# Patient Record
Sex: Female | Born: 1950
Health system: Southern US, Community
[De-identification: ages and names within clinical notes are randomized; demographics above are authoritative.]

## PROBLEM LIST (undated history)

## (undated) DIAGNOSIS — Z973 Presence of spectacles and contact lenses: Secondary | ICD-10-CM

## (undated) DIAGNOSIS — M4800 Spinal stenosis, site unspecified: Secondary | ICD-10-CM

## (undated) DIAGNOSIS — M8580 Other specified disorders of bone density and structure, unspecified site: Secondary | ICD-10-CM

## (undated) DIAGNOSIS — J302 Other seasonal allergic rhinitis: Secondary | ICD-10-CM

## (undated) DIAGNOSIS — I10 Essential (primary) hypertension: Secondary | ICD-10-CM

## (undated) DIAGNOSIS — C801 Malignant (primary) neoplasm, unspecified: Secondary | ICD-10-CM

## (undated) DIAGNOSIS — R011 Cardiac murmur, unspecified: Secondary | ICD-10-CM

## (undated) HISTORY — PX: TUBAL LIGATION: SHX77

## (undated) HISTORY — PX: COLONOSCOPY: SHX174

## (undated) HISTORY — PX: REDUCTION MAMMAPLASTY: SUR839

## (undated) HISTORY — PX: DILATION AND CURETTAGE OF UTERUS: SHX78

## (undated) HISTORY — PX: MASTECTOMY: SHX3

---

## 1987-06-12 HISTORY — PX: SIMPLE MASTECTOMY: SHX6097

## 1987-06-12 HISTORY — PX: BREAST RECONSTRUCTION: SHX9

## 1997-09-09 ENCOUNTER — Encounter: Admission: RE | Admit: 1997-09-09 | Discharge: 1997-12-08 | Payer: Self-pay | Admitting: Orthopedic Surgery

## 1999-03-13 ENCOUNTER — Encounter: Admission: RE | Admit: 1999-03-13 | Discharge: 1999-04-06 | Payer: Self-pay | Admitting: *Deleted

## 1999-04-12 ENCOUNTER — Other Ambulatory Visit: Admission: RE | Admit: 1999-04-12 | Discharge: 1999-04-12 | Payer: Self-pay | Admitting: Internal Medicine

## 1999-06-06 ENCOUNTER — Encounter: Admission: RE | Admit: 1999-06-06 | Discharge: 1999-06-06 | Payer: Self-pay | Admitting: *Deleted

## 1999-06-06 ENCOUNTER — Encounter: Payer: Self-pay | Admitting: *Deleted

## 2000-01-08 ENCOUNTER — Encounter: Admission: RE | Admit: 2000-01-08 | Discharge: 2000-01-08 | Payer: Self-pay | Admitting: Internal Medicine

## 2000-01-08 ENCOUNTER — Encounter: Payer: Self-pay | Admitting: Internal Medicine

## 2000-04-15 ENCOUNTER — Encounter: Admission: RE | Admit: 2000-04-15 | Discharge: 2000-04-30 | Payer: Self-pay | Admitting: Occupational Medicine

## 2000-04-15 ENCOUNTER — Encounter
Admission: RE | Admit: 2000-04-15 | Discharge: 2000-04-30 | Payer: Self-pay | Admitting: Physical Medicine & Rehabilitation

## 2000-04-30 ENCOUNTER — Other Ambulatory Visit: Admission: RE | Admit: 2000-04-30 | Discharge: 2000-04-30 | Payer: Self-pay | Admitting: Internal Medicine

## 2000-05-06 ENCOUNTER — Encounter: Payer: Self-pay | Admitting: Internal Medicine

## 2000-05-06 ENCOUNTER — Encounter: Admission: RE | Admit: 2000-05-06 | Discharge: 2000-05-06 | Payer: Self-pay | Admitting: Internal Medicine

## 2001-01-13 ENCOUNTER — Encounter: Payer: Self-pay | Admitting: Internal Medicine

## 2001-01-13 ENCOUNTER — Encounter: Admission: RE | Admit: 2001-01-13 | Discharge: 2001-01-13 | Payer: Self-pay | Admitting: Internal Medicine

## 2001-05-26 ENCOUNTER — Other Ambulatory Visit: Admission: RE | Admit: 2001-05-26 | Discharge: 2001-05-26 | Payer: Self-pay | Admitting: Internal Medicine

## 2002-01-26 ENCOUNTER — Encounter: Payer: Self-pay | Admitting: Internal Medicine

## 2002-01-26 ENCOUNTER — Encounter: Admission: RE | Admit: 2002-01-26 | Discharge: 2002-01-26 | Payer: Self-pay | Admitting: Internal Medicine

## 2002-02-02 ENCOUNTER — Encounter: Payer: Self-pay | Admitting: Internal Medicine

## 2002-02-02 ENCOUNTER — Ambulatory Visit (HOSPITAL_COMMUNITY): Admission: RE | Admit: 2002-02-02 | Discharge: 2002-02-02 | Payer: Self-pay | Admitting: Internal Medicine

## 2002-07-02 ENCOUNTER — Encounter: Payer: Self-pay | Admitting: Internal Medicine

## 2002-07-02 ENCOUNTER — Ambulatory Visit (HOSPITAL_COMMUNITY): Admission: RE | Admit: 2002-07-02 | Discharge: 2002-07-02 | Payer: Self-pay | Admitting: Internal Medicine

## 2002-07-02 ENCOUNTER — Other Ambulatory Visit: Admission: RE | Admit: 2002-07-02 | Discharge: 2002-07-02 | Payer: Self-pay | Admitting: Internal Medicine

## 2003-02-11 ENCOUNTER — Encounter: Admission: RE | Admit: 2003-02-11 | Discharge: 2003-02-11 | Payer: Self-pay | Admitting: Internal Medicine

## 2003-02-11 ENCOUNTER — Encounter: Payer: Self-pay | Admitting: Internal Medicine

## 2003-07-05 ENCOUNTER — Other Ambulatory Visit: Admission: RE | Admit: 2003-07-05 | Discharge: 2003-07-05 | Payer: Self-pay | Admitting: Internal Medicine

## 2003-07-09 ENCOUNTER — Encounter: Admission: RE | Admit: 2003-07-09 | Discharge: 2003-07-09 | Payer: Self-pay | Admitting: Internal Medicine

## 2003-09-07 ENCOUNTER — Encounter: Admission: RE | Admit: 2003-09-07 | Discharge: 2003-10-27 | Payer: Self-pay | Admitting: Internal Medicine

## 2004-03-01 ENCOUNTER — Emergency Department (HOSPITAL_COMMUNITY): Admission: EM | Admit: 2004-03-01 | Discharge: 2004-03-01 | Payer: Self-pay | Admitting: Emergency Medicine

## 2004-07-13 ENCOUNTER — Other Ambulatory Visit: Admission: RE | Admit: 2004-07-13 | Discharge: 2004-07-13 | Payer: Self-pay | Admitting: Obstetrics and Gynecology

## 2004-07-13 ENCOUNTER — Encounter: Admission: RE | Admit: 2004-07-13 | Discharge: 2004-07-13 | Payer: Self-pay | Admitting: Internal Medicine

## 2004-08-03 ENCOUNTER — Encounter (INDEPENDENT_AMBULATORY_CARE_PROVIDER_SITE_OTHER): Payer: Self-pay | Admitting: *Deleted

## 2004-08-03 ENCOUNTER — Ambulatory Visit (HOSPITAL_COMMUNITY): Admission: RE | Admit: 2004-08-03 | Discharge: 2004-08-03 | Payer: Self-pay | Admitting: Obstetrics and Gynecology

## 2004-08-11 ENCOUNTER — Emergency Department (HOSPITAL_COMMUNITY): Admission: EM | Admit: 2004-08-11 | Discharge: 2004-08-11 | Payer: Self-pay | Admitting: Family Medicine

## 2004-09-07 ENCOUNTER — Ambulatory Visit (HOSPITAL_COMMUNITY): Admission: RE | Admit: 2004-09-07 | Discharge: 2004-09-07 | Payer: Self-pay | Admitting: Gastroenterology

## 2005-07-19 ENCOUNTER — Encounter: Admission: RE | Admit: 2005-07-19 | Discharge: 2005-07-19 | Payer: Self-pay | Admitting: Internal Medicine

## 2005-08-16 ENCOUNTER — Other Ambulatory Visit: Admission: RE | Admit: 2005-08-16 | Discharge: 2005-08-16 | Payer: Self-pay | Admitting: Internal Medicine

## 2006-06-11 HISTORY — PX: SHOULDER ARTHROSCOPY W/ ROTATOR CUFF REPAIR: SHX2400

## 2006-07-22 ENCOUNTER — Encounter: Admission: RE | Admit: 2006-07-22 | Discharge: 2006-07-22 | Payer: Self-pay | Admitting: Internal Medicine

## 2006-08-14 ENCOUNTER — Encounter: Admission: RE | Admit: 2006-08-14 | Discharge: 2006-11-12 | Payer: Self-pay | Admitting: Orthopedic Surgery

## 2006-09-06 ENCOUNTER — Other Ambulatory Visit: Admission: RE | Admit: 2006-09-06 | Discharge: 2006-09-06 | Payer: Self-pay | Admitting: *Deleted

## 2007-03-24 ENCOUNTER — Ambulatory Visit (HOSPITAL_COMMUNITY): Admission: RE | Admit: 2007-03-24 | Discharge: 2007-03-24 | Payer: Self-pay | Admitting: Orthopedic Surgery

## 2007-04-01 ENCOUNTER — Encounter: Admission: RE | Admit: 2007-04-01 | Discharge: 2007-06-11 | Payer: Self-pay | Admitting: Orthopedic Surgery

## 2007-06-17 ENCOUNTER — Encounter: Admission: RE | Admit: 2007-06-17 | Discharge: 2007-07-03 | Payer: Self-pay | Admitting: Orthopedic Surgery

## 2007-07-24 ENCOUNTER — Encounter: Admission: RE | Admit: 2007-07-24 | Discharge: 2007-07-24 | Payer: Self-pay | Admitting: Family Medicine

## 2008-07-30 ENCOUNTER — Encounter: Admission: RE | Admit: 2008-07-30 | Discharge: 2008-07-30 | Payer: Self-pay | Admitting: Family Medicine

## 2008-10-19 ENCOUNTER — Other Ambulatory Visit: Admission: RE | Admit: 2008-10-19 | Discharge: 2008-10-19 | Payer: Self-pay | Admitting: Family Medicine

## 2009-08-01 ENCOUNTER — Encounter: Admission: RE | Admit: 2009-08-01 | Discharge: 2009-08-01 | Payer: Self-pay | Admitting: Family Medicine

## 2009-10-07 ENCOUNTER — Ambulatory Visit (HOSPITAL_COMMUNITY): Admission: RE | Admit: 2009-10-07 | Discharge: 2009-10-07 | Payer: Self-pay | Admitting: Family Medicine

## 2009-10-27 ENCOUNTER — Other Ambulatory Visit: Admission: RE | Admit: 2009-10-27 | Discharge: 2009-10-27 | Payer: Self-pay | Admitting: Family Medicine

## 2010-07-06 ENCOUNTER — Other Ambulatory Visit: Payer: Self-pay | Admitting: Family Medicine

## 2010-07-06 DIAGNOSIS — Z1239 Encounter for other screening for malignant neoplasm of breast: Secondary | ICD-10-CM

## 2010-08-02 ENCOUNTER — Ambulatory Visit
Admission: RE | Admit: 2010-08-02 | Discharge: 2010-08-02 | Disposition: A | Discharge status: Home / Self Care | Payer: Commercial Managed Care - PPO | Source: Ambulatory Visit | Attending: Family Medicine | Admitting: Family Medicine

## 2010-08-02 DIAGNOSIS — Z1239 Encounter for other screening for malignant neoplasm of breast: Secondary | ICD-10-CM

## 2010-10-24 NOTE — Op Note (Signed)
NAME:  Kimberly Arnold, Kimberly Arnold                 ACCOUNT NO.:  [id number]   MEDICAL RECORD NO.:  [id number]          PATIENT TYPE:  AMB   LOCATION:  SDS                          FACILITY:  [facility]   PHYSICIAN:  Almedia Balls. Ranell Patrick, M.D. DATE OF BIRTH:  [birth year]   DATE OF PROCEDURE:  03/24/2007  DATE OF DISCHARGE:                               OPERATIVE REPORT   PREOPERATIVE DIAGNOSIS:  Left shoulder impingement and acromioclavicular  arthritis.   POSTOPERATIVE DIAGNOSES:  1. Left shoulder impingement and partial-thickness rotator cuff tear,      superior labral tear anterior to posterior.  2. Acromioclavicular joint arthritis.   PROCEDURE:  1. Left shoulder arthroscopy.  2. Extensive intra-articular debridement of torn superior labrum      anterior to posterior with arthroscopic biceps tenotomy,      arthroscopic subacromial decompression, arthroscopic debridement of      partial-thickness rotator cuff tear followed by open distal      clavicle excision.   SURGEON:  Almedia Balls. Ranell Patrick, M.D.   ASSISTANT:  Donnie Coffin. Durwin Nora, P.A.   ANESTHESIA:  General plus interscalene block.   ESTIMATED BLOOD LOSS:  Minimal.   FLUID REPLACEMENT:  1200 mL crystalloid.   INSTRUMENT COUNT:  Correct.   COMPLICATIONS:  None.   PERIOPERATIVE ANTIBIOTICS:  Given.   INDICATIONS FOR PROCEDURE:  The patient is a 60 year old female with a  history of worsening left shoulder pain.  The patient was felt to have  chronic impingement, possible labral tear and AC joint arthritis which  is symptomatic.  The patient presents now for operative treatment to  restore function and eliminate pain.  Informed consent was obtained.   DESCRIPTION OF PROCEDURE:  After an adequate level of anesthesia was  achieved, the patient was positioned supine on the operating room table.  She was brought up in the modified beach chair position, and all  neurovascular structures were padded appropriately.  The left shoulder  was examined  under anesthesia.  Full passive range of motion was noted.  No instability was noted. We went ahead and performed a sterile prep and  drape of the left shoulder.  We then entered the shoulder  arthroscopically. We identified a torn superior labrum anterior to  posterior.  This was a degenerative labral tear, not repairable.   We performed a biceps tenotomy and labral debridement back to stable  labral rim.  The anterior inferior labrum was intact, and the posterior  labrum was intact. The subscapularis was normal.  The rotator cuff had a  partial-thickness tear involving the supraspinatus.  This was debrided  using a motorized shaver back to the rotator cuff footprint scuffing  that up to encourage healing.   The capsule appeared to be normal in its thickness and appearance.  There were no loose bodies present within the inferior capsular pouch.  After completion of the intra-articular portion of the surgery we placed  the scope in the subacromial space and performed a thorough subacromial  bursectomy and acromioplasty creating a type 1 acromial shape with a  butcher-block technique using a motorized bur.  Following  a thorough  decompression of the rotator cuff outlet we inspected the cuff from the  bursal surface, and this appeared normal.  Following this we went ahead  and concluded the arthroscopy.  We made a small saber incision overlying  the AC joint.  Dissection was carried down through the subcutaneous  tissue using Bovie electrocautery.  We identified the delto-trapezial  fascia and split that in line with its fibers.  Subperiosteal dissection  of the distal clavicle was performed following excision of the 4 to 5 mm  of the distal clavicle.  We waxed the end of the bone after the distal  clavicle resection.  We thoroughly irrigated the AC interval and then  went ahead and closed the delto- trapezial fascia with #0 Vicryl figure-  of-eight suture followed by 2-0 Vicryl  subcutaneous closure and 4-0  Monocryl for the skin and portals.  Steri-Strips were applied followed  by a sterile dressing.   The patient tolerated the surgery well.      Almedia Balls. Ranell Patrick, M.D.  Electronically Signed     SRN/MEDQ  D:  03/24/2007  T:  03/24/2007  Job:  [id number]

## 2010-10-27 NOTE — Op Note (Signed)
NAME:  Kimberly Arnold, KLUGE NO.:  [id number]   MEDICAL RECORD NO.:  [id number]          PATIENT TYPE:  AMB   LOCATION:  ENDO                         FACILITY:  [facility]   PHYSICIAN:  Danise Edge, M.D.   DATE OF BIRTH:  [birth year]   DATE OF PROCEDURE:  09/07/2004  DATE OF DISCHARGE:                                 OPERATIVE REPORT   PROCEDURE:  Colonoscopy.   INDICATIONS:  Ms. Emalia Witkop is a 60 year old female, born Jun 12, 1950.  Ms. Mcconnon is scheduled to undergo her first screening colonoscopy with  polypectomy to prevent colon cancer.   ENDOSCOPIST:  Dr. Reece Agar   PREMEDICATION:  1.  Versed 9 mg.  2.  Demerol 70 mg.   PROCEDURE:  After obtaining informed consent, Ms. Jhaveri was placed in the  left lateral decubitus position.  I administered intravenous Demerol and  intravenous Versed to achieve conscious sedation for the procedure.  The  patient's blood pressure, oxygen saturation, and cardiac rhythm were  monitored throughout the procedure and documented in the medical record.   Anal inspection and digital rectal exam were normal.  The Olympus adjustable  pediatric colonoscope was introduced into the rectum and advanced to the  cecum.  Colonic preparation for the exam today was excellent.  I had some  degree of difficulty in rounding the hepatic flexure and advancing the  endoscope to the cecum as a reminder for future colonoscopic exams.   Rectum normal.  Sigmoid colon and descending colon normal.  Splenic flexure normal.  Transverse colon normal.  Hepatic flexure normal.  Ascending colon normal  Cecum and ileocecal valve normal.   ASSESSMENT:  Normal screening proctocolonoscopy to the cecum.      MJ/MEDQ  D:  09/07/2004  T:  09/07/2004  Job:  [id number]   cc:   Darius Bump, M.D.  [place]  [place] [address]  Fax: [phone]

## 2010-10-27 NOTE — H&P (Signed)
NAME:  Kimberly Arnold, Kimberly Arnold                 ACCOUNT NO.:  [id number]   MEDICAL RECORD NO.:  [id number]          PATIENT TYPE:  AMB   LOCATION:  SDC                           [facility]  [facility]   PHYSICIAN:  Charles A. Delcambre, MDDATE OF BIRTH:  05/25/1951   DATE OF ADMISSION:  08/04/2004  DATE OF DISCHARGE:                                HISTORY & PHYSICAL   This patient is being admitted, on August 04, 2004, to undergo  hysteroscopy, D&C for post menopausal bleeding and thickened endometrium.  Limited ability to pass Pipelle to only 5 cm with a sound to 8 cm was noted,  for this reason with inadequate ability to get an adequate biopsy.  We will  proceed with hysteroscopy, D&C at this time.  Ultrasound had been done  showing endometrial thickness to be 1.3 cm, suspicious.  She is a 60-year-  old, para 1-0-0-1, who over the last two months has been spotting almost  daily.  Last Pap, in February 2006, was negative.   PAST MEDICAL HISTORY:  1.  Hypertension.  2.  Breast cancer.  3.  Osteopenia.  4.  Mild aortic stenosis, aortic sclerosis she states.  5.  Hypercalcemia.  6.  Chronic back pain.   SURGICAL HISTORY:  1.  Mastectomy, June 1989, on the left, breast cancer.  2.  Tubal ligation.  3.  Breast cancer NED since that time.   MEDICATIONS:  1.  Norvasc 10 mg once a day.  2.  Skelaxin rarely for back pain.  3.  Cozaar 50 mg once a day.   ALLERGIES:  1.  ACE INHIBITOR cause a cough.  2.  Bradycardia on BETA-BLOCKER.  3.  ULTRAM and  4.  TETRACYCLINE case rash.  5.  HYDROCHLOROTHIAZIDE elevated calcium.   SOCIAL HISTORY:  Denies tobacco, ethanol, or drug use, or STD exposure in  the past.  The patient is married in a monogamous relationship with her  husband.  Works as a Associate Professor at [employer].   REVIEW OF SYSTEMS:  Denies fever, chills, rashes, lesions, headaches,  dizziness, some seasonal allergies, no chest pain, shortness of breath,  wheezing,  diarrhea, constipation, rectal bleeding, urgency, frequency,  dysuria, galactorrhea, or emotional changes.   PHYSICAL EXAMINATION:  GENERAL:  Alert and oriented  x 3.  No distress.  VITAL SIGNS:  Blood pressure 132/88, heart rate 76, weight 191 pounds,  respirations 18.  HEENT:  Grossly within normal limits.  NECK:  Supple without thyromegaly or adenopathy.  LUNGS:  Clear bilaterally.  BACK:  No CVAT.  __________ nontender to palpation.  HEART:  Regular rate and rhythm without murmur, rub or gallop.  BREASTS:  Symmetrical, otherwise deferred.  Reports normal exam with PCP  recently.  ABDOMEN:  Soft, flat, nontender, no hepatosplenomegaly or masses noted.  PELVIC:  Normal external female __________ discharge or lesions.  No  cervical motion tenderness present.  Multiparous cervix.  Bimanual  examination uterus anteverted, eight weeks size, mobile, nontender.  Adnexa  nontender without masses bilaterally.  Ovaries not palpable bilaterally.  RECTAL:  Not done.  Hemorrhoids not present.  Anus and perineal body  appeared normal.   ASSESSMENT:  Perimenopausal/likely post menopausal bleeding, extended cycle,  and irregular cycling.   PLAN:  1.  Hysteroscopy D&C.  2.  Nothing by mouth past midnight.  3.  Take blood pressure medicines with sip of water the morning of      admission.  4.  Preoperative CBC, chem 20, an EKG, and chest x-ray per anesthesia.   She accepts risks of infection, bleeding, bowel and bladder damage, blood  product risk including hepatitis and HIV exposure, uterine perforation risk.  All questions were answered.  We will proceed as outlined.      CAD/MEDQ  D:  07/28/2004  T:  07/28/2004  Job:  [id number]

## 2010-10-27 NOTE — Op Note (Signed)
NAME:  Kimberly Arnold, Kimberly Arnold                 ACCOUNT NO.:  [id number]   MEDICAL RECORD NO.:  [id number]          PATIENT TYPE:  AMB   LOCATION:  SDC                           [facility]   PHYSICIAN:  Charles A. Delcambre, MDDATE OF BIRTH:  11/14/1950   DATE OF PROCEDURE:  08/03/2004  DATE OF DISCHARGE:                                 OPERATIVE REPORT   PREOPERATIVE DIAGNOSES:  1.  Postmenopausal bleeding versus perimenopausal bleeding.  2.  Menorrhagia.   POSTOPERATIVE DIAGNOSES:  1.  Postmenopausal bleeding versus perimenopausal bleeding.  2.  Menorrhagia.  3.  Endometrial polyp.   PROCEDURES:  1.  Paracervical block.  2.  Hysteroscopy.  3.  Dilation and curettage.  4.  Polypectomy.   SURGEON:  Charles A. Delcambre, MD   ASSISTANT:  None.   COMPLICATIONS:  None.   ESTIMATED BLOOD LOSS:  Less than 25 mL.   SPECIMENS:  1.  Endometrial curettings.  2.  Endometrial polyp.   ANESTHESIA:  Monitored anesthesia care and IV sedation.   SORBITOL LOSS:  Less than 30 mL, noting approximately 50 mL on a sponge.  The suction was not working properly; therefore, loss is estimated to be 15  mL.   OPERATIVE FINDINGS:  Sound to 7 cm.  No evidence of perforation during the  procedure.  Endometrial cavity with an approximately 1.5 cm fundal hyperemic  appearing polyp, otherwise somewhat suspicious.  No suspicious findings  otherwise in the endometrial cavity.  The cavity did not appear to be  atrophic but otherwise normal.  Cavity clearly seen.   DESCRIPTION OF PROCEDURE:  The patient was taken to the operating room and  placed in the supine position.  Sedation was accomplished, and she was  placed in the dorsal lithotomy position and sterile prep and drape was  undertaken.  A single-tooth tenaculum was placed on the cervix, a weighted  speculum in the vagina, and a paracervical block with 0.25% plain Marcaine  was placed and divided 20 mL at 4 and 8 o'clock.  No evidence of  intravascular injection was noted.  The sound was noted to 7 cm, and Hegar  dilators were used to dilate it up pass a 5 mm hysteroscope.  Under  visualization, the hysteroscope was passed to the fundus and there was no  evidence of perforation.  The findings were noted above.  Polyp forceps were  used, given no evidence of perforation the polyp tissue was removed.  A  small banjo curette was passed without evidence of perforation, and  circumferential curetting was undertaken.  A moderate amount of tissue was  obtained with curetting.  Bleeding was minimal, hemostasis was adequate.  All instruments were removed.  The patient was taken to recovery with  physician in attendance, having tolerated her procedure well.      CAD/MEDQ  D:  08/03/2004  T:  08/03/2004  Job:  [id number]

## 2011-01-14 ENCOUNTER — Inpatient Hospital Stay (INDEPENDENT_AMBULATORY_CARE_PROVIDER_SITE_OTHER)
Admission: RE | Admit: 2011-01-14 | Discharge: 2011-01-14 | Disposition: A | Discharge status: Home / Self Care | Payer: Commercial Managed Care - PPO | Source: Ambulatory Visit | Attending: Family Medicine | Admitting: Family Medicine

## 2011-01-14 DIAGNOSIS — L988 Other specified disorders of the skin and subcutaneous tissue: Secondary | ICD-10-CM

## 2011-03-22 LAB — DIFFERENTIAL
Basophils Absolute: 0
Basophils Relative: 1
Eosinophils Absolute: 0.2
Eosinophils Relative: 4
Lymphocytes Relative: 50 — ABNORMAL HIGH
Lymphs Abs: 2.7
Monocytes Absolute: 0.6
Monocytes Relative: 11
Neutro Abs: 1.9
Neutrophils Relative %: 35 — ABNORMAL LOW

## 2011-03-22 LAB — URINALYSIS, ROUTINE W REFLEX MICROSCOPIC
Bilirubin Urine: NEGATIVE
Bilirubin Urine: NEGATIVE
Glucose, UA: NEGATIVE
Glucose, UA: NEGATIVE
Hgb urine dipstick: NEGATIVE
Hgb urine dipstick: NEGATIVE
Ketones, ur: NEGATIVE
Ketones, ur: NEGATIVE
Nitrite: NEGATIVE
Nitrite: NEGATIVE
Protein, ur: NEGATIVE
Protein, ur: NEGATIVE
Specific Gravity, Urine: 1.006
Specific Gravity, Urine: 1.012
Urobilinogen, UA: 0.2
Urobilinogen, UA: 0.2
pH: 7
pH: 7

## 2011-03-22 LAB — BASIC METABOLIC PANEL
BUN: 11
Chloride: 106
Creatinine, Ser: 0.77
GFR calc Af Amer: >60
GFR calc non Af Amer: >60

## 2011-03-22 LAB — URINE CULTURE

## 2011-03-22 LAB — URINE MICROSCOPIC-ADD ON

## 2011-03-22 LAB — APTT: aPTT: 35

## 2011-03-22 LAB — PROTIME-INR
INR: 1
Prothrombin Time: 12.9

## 2011-03-22 LAB — CBC
MCV: 81.5
Platelets: 221
RBC: 5.01
WBC: 5.4

## 2011-03-22 LAB — TYPE AND SCREEN
ABO/RH(D): AB POS
Antibody Screen: NEGATIVE

## 2011-06-26 ENCOUNTER — Other Ambulatory Visit (HOSPITAL_COMMUNITY): Payer: Self-pay | Admitting: Neurosurgery

## 2011-06-26 DIAGNOSIS — M48061 Spinal stenosis, lumbar region without neurogenic claudication: Secondary | ICD-10-CM

## 2011-06-26 DIAGNOSIS — M4310 Spondylolisthesis, site unspecified: Secondary | ICD-10-CM

## 2011-07-06 ENCOUNTER — Ambulatory Visit (HOSPITAL_COMMUNITY)
Admission: RE | Admit: 2011-07-06 | Discharge: 2011-07-06 | Disposition: A | Discharge status: Home / Self Care | Payer: 59 | Source: Ambulatory Visit | Attending: Neurosurgery | Admitting: Neurosurgery

## 2011-07-06 DIAGNOSIS — R209 Unspecified disturbances of skin sensation: Secondary | ICD-10-CM | POA: Insufficient documentation

## 2011-07-06 DIAGNOSIS — M4310 Spondylolisthesis, site unspecified: Secondary | ICD-10-CM

## 2011-07-06 DIAGNOSIS — M48061 Spinal stenosis, lumbar region without neurogenic claudication: Secondary | ICD-10-CM

## 2011-07-06 DIAGNOSIS — M5126 Other intervertebral disc displacement, lumbar region: Secondary | ICD-10-CM | POA: Insufficient documentation

## 2011-07-06 DIAGNOSIS — M545 Low back pain, unspecified: Secondary | ICD-10-CM | POA: Insufficient documentation

## 2011-07-06 DIAGNOSIS — R29898 Other symptoms and signs involving the musculoskeletal system: Secondary | ICD-10-CM | POA: Insufficient documentation

## 2011-07-06 DIAGNOSIS — M47817 Spondylosis without myelopathy or radiculopathy, lumbosacral region: Secondary | ICD-10-CM | POA: Insufficient documentation

## 2011-07-06 DIAGNOSIS — M79609 Pain in unspecified limb: Secondary | ICD-10-CM | POA: Insufficient documentation

## 2011-08-14 ENCOUNTER — Other Ambulatory Visit: Payer: Self-pay | Admitting: Family Medicine

## 2011-08-14 DIAGNOSIS — Z1231 Encounter for screening mammogram for malignant neoplasm of breast: Secondary | ICD-10-CM

## 2011-08-23 ENCOUNTER — Ambulatory Visit
Admission: RE | Admit: 2011-08-23 | Discharge: 2011-08-23 | Disposition: A | Discharge status: Home / Self Care | Payer: 59 | Source: Ambulatory Visit | Attending: Family Medicine | Admitting: Family Medicine

## 2011-08-23 DIAGNOSIS — Z1231 Encounter for screening mammogram for malignant neoplasm of breast: Secondary | ICD-10-CM

## 2012-07-16 ENCOUNTER — Other Ambulatory Visit: Payer: Self-pay | Admitting: Family Medicine

## 2012-07-16 DIAGNOSIS — Z1231 Encounter for screening mammogram for malignant neoplasm of breast: Secondary | ICD-10-CM

## 2012-07-16 DIAGNOSIS — Z9012 Acquired absence of left breast and nipple: Secondary | ICD-10-CM

## 2012-08-26 ENCOUNTER — Ambulatory Visit
Admission: RE | Admit: 2012-08-26 | Discharge: 2012-08-26 | Disposition: A | Discharge status: Home / Self Care | Payer: 59 | Source: Ambulatory Visit | Attending: Family Medicine | Admitting: Family Medicine

## 2012-08-26 DIAGNOSIS — Z1231 Encounter for screening mammogram for malignant neoplasm of breast: Secondary | ICD-10-CM

## 2012-08-26 DIAGNOSIS — Z9012 Acquired absence of left breast and nipple: Secondary | ICD-10-CM

## 2012-12-04 ENCOUNTER — Other Ambulatory Visit (HOSPITAL_COMMUNITY)
Admission: RE | Admit: 2012-12-04 | Discharge: 2012-12-04 | Disposition: A | Discharge status: Home / Self Care | Payer: 59 | Source: Ambulatory Visit | Attending: Family Medicine | Admitting: Family Medicine

## 2012-12-04 ENCOUNTER — Other Ambulatory Visit: Payer: Self-pay | Admitting: Family Medicine

## 2012-12-04 DIAGNOSIS — Z124 Encounter for screening for malignant neoplasm of cervix: Secondary | ICD-10-CM | POA: Insufficient documentation

## 2013-07-31 ENCOUNTER — Other Ambulatory Visit: Payer: Self-pay

## 2013-07-31 DIAGNOSIS — Z1231 Encounter for screening mammogram for malignant neoplasm of breast: Secondary | ICD-10-CM

## 2013-08-27 ENCOUNTER — Ambulatory Visit
Admission: RE | Admit: 2013-08-27 | Discharge: 2013-08-27 | Disposition: A | Discharge status: Home / Self Care | Payer: BC Managed Care – PPO | Source: Ambulatory Visit

## 2013-08-27 DIAGNOSIS — Z1231 Encounter for screening mammogram for malignant neoplasm of breast: Secondary | ICD-10-CM

## 2013-10-06 ENCOUNTER — Other Ambulatory Visit: Payer: Self-pay | Admitting: Family Medicine

## 2013-10-06 DIAGNOSIS — T8549XA Other mechanical complication of breast prosthesis and implant, initial encounter: Secondary | ICD-10-CM

## 2013-10-06 DIAGNOSIS — T8543XA Leakage of breast prosthesis and implant, initial encounter: Secondary | ICD-10-CM

## 2013-10-07 ENCOUNTER — Ambulatory Visit
Admission: RE | Admit: 2013-10-07 | Discharge: 2013-10-07 | Disposition: A | Discharge status: Home / Self Care | Payer: BC Managed Care – PPO | Source: Ambulatory Visit | Attending: Family Medicine | Admitting: Family Medicine

## 2013-10-07 DIAGNOSIS — T8549XA Other mechanical complication of breast prosthesis and implant, initial encounter: Secondary | ICD-10-CM

## 2013-10-07 DIAGNOSIS — T8543XA Leakage of breast prosthesis and implant, initial encounter: Secondary | ICD-10-CM

## 2013-11-04 ENCOUNTER — Encounter (HOSPITAL_BASED_OUTPATIENT_CLINIC_OR_DEPARTMENT_OTHER): Payer: Self-pay | Admitting: *Deleted

## 2013-11-04 NOTE — Progress Notes (Signed)
To come in for bmet-will get ekg dr Laurann Montana

## 2013-11-05 ENCOUNTER — Encounter (HOSPITAL_BASED_OUTPATIENT_CLINIC_OR_DEPARTMENT_OTHER)
Admission: RE | Admit: 2013-11-05 | Discharge: 2013-11-05 | Disposition: A | Discharge status: Home / Self Care | Payer: BC Managed Care – PPO | Source: Ambulatory Visit | Attending: Specialist | Admitting: Specialist

## 2013-11-05 DIAGNOSIS — Z79899 Other long term (current) drug therapy: Secondary | ICD-10-CM | POA: Diagnosis not present

## 2013-11-05 DIAGNOSIS — Z7982 Long term (current) use of aspirin: Secondary | ICD-10-CM | POA: Diagnosis not present

## 2013-11-05 DIAGNOSIS — Z853 Personal history of malignant neoplasm of breast: Secondary | ICD-10-CM | POA: Insufficient documentation

## 2013-11-05 DIAGNOSIS — I10 Essential (primary) hypertension: Secondary | ICD-10-CM | POA: Insufficient documentation

## 2013-11-05 DIAGNOSIS — Z888 Allergy status to other drugs, medicaments and biological substances status: Secondary | ICD-10-CM | POA: Diagnosis not present

## 2013-11-05 DIAGNOSIS — Y838 Other surgical procedures as the cause of abnormal reaction of the patient, or of later complication, without mention of misadventure at the time of the procedure: Secondary | ICD-10-CM | POA: Insufficient documentation

## 2013-11-05 DIAGNOSIS — Z6831 Body mass index (BMI) 31.0-31.9, adult: Secondary | ICD-10-CM | POA: Diagnosis not present

## 2013-11-05 DIAGNOSIS — T8549XA Other mechanical complication of breast prosthesis and implant, initial encounter: Secondary | ICD-10-CM | POA: Insufficient documentation

## 2013-11-05 DIAGNOSIS — Y9210 Unspecified residential institution as the place of occurrence of the external cause: Secondary | ICD-10-CM | POA: Insufficient documentation

## 2013-11-05 LAB — BASIC METABOLIC PANEL
BUN: 13 mg/dL (ref 6–23)
CALCIUM: 10.2 mg/dL (ref 8.4–10.5)
CHLORIDE: 106 meq/L (ref 96–112)
CO2: 26 meq/L (ref 19–32)
Creatinine, Ser: 0.81 mg/dL (ref 0.50–1.10)
GFR calc Af Amer: 87 mL/min — ABNORMAL LOW (ref >90)
GFR calc non Af Amer: 75 mL/min — ABNORMAL LOW (ref >90)
GLUCOSE: 94 mg/dL (ref 70–99)
POTASSIUM: 3.8 meq/L (ref 3.7–5.3)
SODIUM: 144 meq/L (ref 137–147)

## 2013-11-09 ENCOUNTER — Ambulatory Visit (HOSPITAL_BASED_OUTPATIENT_CLINIC_OR_DEPARTMENT_OTHER): Payer: BC Managed Care – PPO | Admitting: Anesthesiology

## 2013-11-09 ENCOUNTER — Encounter (HOSPITAL_BASED_OUTPATIENT_CLINIC_OR_DEPARTMENT_OTHER)
Admission: RE | Disposition: A | Discharge status: Home / Self Care | Payer: Self-pay | Source: Ambulatory Visit | Attending: Specialist

## 2013-11-09 ENCOUNTER — Encounter (HOSPITAL_BASED_OUTPATIENT_CLINIC_OR_DEPARTMENT_OTHER): Payer: BC Managed Care – PPO | Admitting: Anesthesiology

## 2013-11-09 ENCOUNTER — Ambulatory Visit (HOSPITAL_BASED_OUTPATIENT_CLINIC_OR_DEPARTMENT_OTHER)
Admission: RE | Admit: 2013-11-09 | Discharge: 2013-11-09 | Disposition: A | Discharge status: Home / Self Care | Payer: BC Managed Care – PPO | Source: Ambulatory Visit | Attending: Specialist | Admitting: Specialist

## 2013-11-09 ENCOUNTER — Encounter (HOSPITAL_BASED_OUTPATIENT_CLINIC_OR_DEPARTMENT_OTHER): Payer: Self-pay

## 2013-11-09 DIAGNOSIS — Z79899 Other long term (current) drug therapy: Secondary | ICD-10-CM | POA: Diagnosis not present

## 2013-11-09 DIAGNOSIS — T8549XA Other mechanical complication of breast prosthesis and implant, initial encounter: Secondary | ICD-10-CM | POA: Diagnosis present

## 2013-11-09 DIAGNOSIS — I10 Essential (primary) hypertension: Secondary | ICD-10-CM | POA: Diagnosis not present

## 2013-11-09 DIAGNOSIS — Y9210 Unspecified residential institution as the place of occurrence of the external cause: Secondary | ICD-10-CM | POA: Diagnosis not present

## 2013-11-09 DIAGNOSIS — Y838 Other surgical procedures as the cause of abnormal reaction of the patient, or of later complication, without mention of misadventure at the time of the procedure: Secondary | ICD-10-CM | POA: Diagnosis not present

## 2013-11-09 DIAGNOSIS — Z6831 Body mass index (BMI) 31.0-31.9, adult: Secondary | ICD-10-CM | POA: Diagnosis not present

## 2013-11-09 DIAGNOSIS — Z853 Personal history of malignant neoplasm of breast: Secondary | ICD-10-CM | POA: Diagnosis not present

## 2013-11-09 DIAGNOSIS — Z7982 Long term (current) use of aspirin: Secondary | ICD-10-CM | POA: Diagnosis not present

## 2013-11-09 DIAGNOSIS — Z888 Allergy status to other drugs, medicaments and biological substances status: Secondary | ICD-10-CM | POA: Diagnosis not present

## 2013-11-09 HISTORY — PX: BREAST CAPSULECTOMY WITH IMPLANT EXCHANGE: SHX5592

## 2013-11-09 HISTORY — DX: Other specified disorders of bone density and structure, unspecified site: M85.80

## 2013-11-09 HISTORY — DX: Malignant (primary) neoplasm, unspecified: C80.1

## 2013-11-09 HISTORY — DX: Other seasonal allergic rhinitis: J30.2

## 2013-11-09 HISTORY — DX: Presence of spectacles and contact lenses: Z97.3

## 2013-11-09 HISTORY — DX: Essential (primary) hypertension: I10

## 2013-11-09 LAB — POCT HEMOGLOBIN-HEMACUE
HEMOGLOBIN: 19.1 g/dL — AB (ref 12.0–15.0)
HEMOGLOBIN: 6.8 g/dL — AB (ref 12.0–15.0)

## 2013-11-09 SURGERY — CAPSULECTOMY, BREAST, WITH REPLACEMENT OF IMPLANT
Anesthesia: General | Site: Breast | Laterality: Left

## 2013-11-09 MED ORDER — FENTANYL CITRATE 0.05 MG/ML IJ SOLN
INTRAMUSCULAR | Status: AC
  Filled 2013-11-09: qty 6

## 2013-11-09 MED ORDER — PROPOFOL 10 MG/ML IV BOLUS
INTRAVENOUS | Status: DC | PRN
  Administered 2013-11-09: 150 mg via INTRAVENOUS

## 2013-11-09 MED ORDER — SUCCINYLCHOLINE CHLORIDE 20 MG/ML IJ SOLN
INTRAMUSCULAR | Status: DC | PRN
  Administered 2013-11-09: 100 mg via INTRAVENOUS

## 2013-11-09 MED ORDER — OXYCODONE HCL 5 MG PO TABS
ORAL_TABLET | ORAL | Status: AC
  Filled 2013-11-09: qty 1

## 2013-11-09 MED ORDER — LIDOCAINE HCL (CARDIAC) 20 MG/ML IV SOLN
INTRAVENOUS | Status: DC | PRN
  Administered 2013-11-09: 60 mg via INTRAVENOUS

## 2013-11-09 MED ORDER — HYDROMORPHONE HCL PF 1 MG/ML IJ SOLN
INTRAMUSCULAR | Status: AC
  Filled 2013-11-09: qty 1

## 2013-11-09 MED ORDER — HYDROMORPHONE HCL PF 1 MG/ML IJ SOLN
0.2500 mg | INTRAMUSCULAR | Status: DC | PRN
  Administered 2013-11-09: 0.5 mg via INTRAVENOUS

## 2013-11-09 MED ORDER — MIDAZOLAM HCL 2 MG/2ML IJ SOLN
INTRAMUSCULAR | Status: AC
  Filled 2013-11-09: qty 2

## 2013-11-09 MED ORDER — ONDANSETRON HCL 4 MG/2ML IJ SOLN
INTRAMUSCULAR | Status: DC | PRN
  Administered 2013-11-09: 4 mg via INTRAVENOUS

## 2013-11-09 MED ORDER — SODIUM CHLORIDE 0.9 % IV SOLN
INTRAVENOUS | Status: DC | PRN
  Administered 2013-11-09: 08:00:00

## 2013-11-09 MED ORDER — FENTANYL CITRATE 0.05 MG/ML IJ SOLN: 50.0000 ug | INTRAMUSCULAR | Status: DC | PRN

## 2013-11-09 MED ORDER — MIDAZOLAM HCL 2 MG/2ML IJ SOLN: 1.0000 mg | INTRAMUSCULAR | Status: DC | PRN

## 2013-11-09 MED ORDER — LACTATED RINGERS IV SOLN
INTRAVENOUS | Status: DC
Start: 2013-11-09 — End: 2013-11-09
  Administered 2013-11-09: 07:00:00 via INTRAVENOUS

## 2013-11-09 MED ORDER — FENTANYL CITRATE 0.05 MG/ML IJ SOLN
INTRAMUSCULAR | Status: DC | PRN
  Administered 2013-11-09: 100 ug via INTRAVENOUS

## 2013-11-09 MED ORDER — DEXAMETHASONE SODIUM PHOSPHATE 4 MG/ML IJ SOLN
INTRAMUSCULAR | Status: DC | PRN
  Administered 2013-11-09: 10 mg via INTRAVENOUS

## 2013-11-09 MED ORDER — LIDOCAINE-EPINEPHRINE 0.5 %-1:200000 IJ SOLN
INTRAMUSCULAR | Status: AC
  Filled 2013-11-09: qty 2

## 2013-11-09 MED ORDER — OXYCODONE HCL 5 MG/5ML PO SOLN: 5.0000 mg | Freq: Once | ORAL | Status: AC | PRN

## 2013-11-09 MED ORDER — CEFAZOLIN SODIUM-DEXTROSE 2-3 GM-% IV SOLR
2.0000 g | INTRAVENOUS | Status: AC
  Administered 2013-11-09: 2 g via INTRAVENOUS

## 2013-11-09 MED ORDER — EPHEDRINE SULFATE 50 MG/ML IJ SOLN
INTRAMUSCULAR | Status: DC | PRN
  Administered 2013-11-09: 10 mg via INTRAVENOUS
  Administered 2013-11-09: 15 mg via INTRAVENOUS

## 2013-11-09 MED ORDER — PROMETHAZINE HCL 25 MG/ML IJ SOLN: 6.2500 mg | INTRAMUSCULAR | Status: DC | PRN

## 2013-11-09 MED ORDER — MIDAZOLAM HCL 2 MG/2ML IJ SOLN: 0.5000 mg | Freq: Once | INTRAMUSCULAR | Status: DC | PRN

## 2013-11-09 MED ORDER — MIDAZOLAM HCL 5 MG/5ML IJ SOLN
INTRAMUSCULAR | Status: DC | PRN
  Administered 2013-11-09: 1 mg via INTRAVENOUS

## 2013-11-09 MED ORDER — LIDOCAINE-EPINEPHRINE 0.5 %-1:200000 IJ SOLN
INTRAMUSCULAR | Status: DC | PRN
  Administered 2013-11-09: 70 mL

## 2013-11-09 MED ORDER — CEFAZOLIN SODIUM-DEXTROSE 2-3 GM-% IV SOLR
INTRAVENOUS | Status: AC
  Filled 2013-11-09: qty 50

## 2013-11-09 MED ORDER — OXYCODONE HCL 5 MG PO TABS
5.0000 mg | ORAL_TABLET | Freq: Once | ORAL | Status: AC | PRN
  Administered 2013-11-09: 5 mg via ORAL

## 2013-11-09 MED ORDER — MEPERIDINE HCL 25 MG/ML IJ SOLN: 6.2500 mg | INTRAMUSCULAR | Status: DC | PRN

## 2013-11-09 SURGICAL SUPPLY — 59 items
BAG DECANTER FOR FLEXI CONT (MISCELLANEOUS) ×2 IMPLANT
BENZOIN TINCTURE PRP APPL 2/3 (GAUZE/BANDAGES/DRESSINGS) ×2 IMPLANT
BINDER BREAST XLRG (GAUZE/BANDAGES/DRESSINGS) ×2 IMPLANT
BLADE KNIFE PERSONA 10 (BLADE) ×2 IMPLANT
BLADE KNIFE PERSONA 15 (BLADE) ×2 IMPLANT
CANISTER SUCT 1200ML W/VALVE (MISCELLANEOUS) ×2 IMPLANT
COVER MAYO STAND STRL (DRAPES) ×2 IMPLANT
COVER TABLE BACK 60X90 (DRAPES) ×2 IMPLANT
DECANTER SPIKE VIAL GLASS SM (MISCELLANEOUS) ×2 IMPLANT
DRAIN CHANNEL 10M FLAT 3/4 FLT (DRAIN) IMPLANT
DRAIN PENROSE 1/4X12 LTX STRL (WOUND CARE) IMPLANT
DRAPE LAPAROSCOPIC ABDOMINAL (DRAPES) ×2 IMPLANT
DRSG PAD ABDOMINAL 8X10 ST (GAUZE/BANDAGES/DRESSINGS) ×4 IMPLANT
ELECT BLADE 6.5 .24CM SHAFT (ELECTRODE) ×2 IMPLANT
ELECT REM PT RETURN 9FT ADLT (ELECTROSURGICAL) ×2
ELECTRODE REM PT RTRN 9FT ADLT (ELECTROSURGICAL) ×1 IMPLANT
EVACUATOR SILICONE 100CC (DRAIN) ×2 IMPLANT
GAUZE SPONGE 4X4 12PLY STRL (GAUZE/BANDAGES/DRESSINGS) ×2 IMPLANT
GAUZE XEROFORM 5X9 LF (GAUZE/BANDAGES/DRESSINGS) ×2 IMPLANT
GLOVE BIO SURGEON STRL SZ 6.5 (GLOVE) ×2 IMPLANT
GLOVE BIOGEL M STRL SZ7.5 (GLOVE) ×2 IMPLANT
GLOVE BIOGEL PI IND STRL 8 (GLOVE) ×1 IMPLANT
GLOVE BIOGEL PI INDICATOR 8 (GLOVE) ×1
GLOVE ECLIPSE 7.0 STRL STRAW (GLOVE) ×2 IMPLANT
GOWN STRL REUS W/TWL XL LVL3 (GOWN DISPOSABLE) ×4 IMPLANT
IMPLANT SALINE 525CC PLUS 50 (Breast) ×2 IMPLANT
IV NS 500ML (IV SOLUTION) ×1
IV NS 500ML BAXH (IV SOLUTION) ×1 IMPLANT
KIT FILL SYSTEM UNIVERSAL (SET/KITS/TRAYS/PACK) ×2 IMPLANT
NDL SAFETY ECLIPSE 18X1.5 (NEEDLE) ×2 IMPLANT
NEEDLE HYPO 18GX1.5 SHARP (NEEDLE) ×2
NEEDLE HYPO 25X1 1.5 SAFETY (NEEDLE) ×2 IMPLANT
NEEDLE SPNL 18GX3.5 QUINCKE PK (NEEDLE) ×2 IMPLANT
NS IRRIG 1000ML POUR BTL (IV SOLUTION) ×2 IMPLANT
PACK BASIN DAY SURGERY FS (CUSTOM PROCEDURE TRAY) ×2 IMPLANT
PEN SKIN MARKING BROAD TIP (MISCELLANEOUS) ×2 IMPLANT
PIN SAFETY STERILE (MISCELLANEOUS) IMPLANT
SLEEVE SCD COMPRESS KNEE MED (MISCELLANEOUS) ×2 IMPLANT
SPONGE LAP 18X18 X RAY DECT (DISPOSABLE) ×4 IMPLANT
STRIP SUTURE WOUND CLOSURE 1/2 (SUTURE) IMPLANT
SUT MNCRL AB 3-0 PS2 18 (SUTURE) ×2 IMPLANT
SUT MON AB 2-0 CT1 36 (SUTURE) ×2 IMPLANT
SUT MON AB 5-0 PS2 18 (SUTURE) IMPLANT
SUT PROLENE 3 0 PS 2 (SUTURE) IMPLANT
SWAB COLLECTION DEVICE MRSA (MISCELLANEOUS) ×2 IMPLANT
SYR 20CC LL (SYRINGE) IMPLANT
SYR 50ML LL SCALE MARK (SYRINGE) ×4 IMPLANT
SYR BULB IRRIGATION 50ML (SYRINGE) ×2 IMPLANT
SYR CONTROL 10ML LL (SYRINGE) ×2 IMPLANT
TAPE HYPAFIX 6X30 (GAUZE/BANDAGES/DRESSINGS) ×2 IMPLANT
TAPE MEASURE 72IN RETRACT (INSTRUMENTS)
TAPE MEASURE LINEN 72IN RETRCT (INSTRUMENTS) IMPLANT
TOWEL OR 17X24 6PK STRL BLUE (TOWEL DISPOSABLE) ×8 IMPLANT
TRAY DSU PREP LF (CUSTOM PROCEDURE TRAY) ×2 IMPLANT
TUBE ANAEROBIC SPECIMEN COL (MISCELLANEOUS) ×2 IMPLANT
TUBE CONNECTING 20X1/4 (TUBING) ×2 IMPLANT
UNDERPAD 30X30 INCONTINENT (UNDERPADS AND DIAPERS) ×4 IMPLANT
VAC PENCILS W/TUBING CLEAR (MISCELLANEOUS) ×2 IMPLANT
YANKAUER SUCT BULB TIP NO VENT (SUCTIONS) ×2 IMPLANT

## 2013-11-09 NOTE — Brief Op Note (Signed)
11/09/2013  6:26 AM  PATIENT:  Kimberly Arnold  63 y.o. female  PRE-OPERATIVE DIAGNOSIS:  Malignant neoplasm of breast (female), unspecified site left   POST-OPERATIVE DIAGNOSIS:  Malignant neoplasm of breast (female), unspecified site left  PROCEDURE:  Procedure(s): REPLACEMENT OF LEFT BREAST IMPLANT CAPSULECTOMY RELEASE OF SCAR TISSUE  (Left)  SURGEON:  Surgeon(s) and Role:    * [name], MD - Primary  PHYSICIAN ASSISTANT:   ASSISTANTS: none   ANESTHESIA:   general  EBL:  Total I/O In: 1200 [I.V.:1200] Out: -   BLOOD ADMINISTERED:none  DRAINS: none   LOCAL MEDICATIONS USED:  LIDOCAINE   SPECIMEN:  Excision  DISPOSITION OF SPECIMEN:  PATHOLOGY  COUNTS:  YES  TOURNIQUET:  * No tourniquets in log *  DICTATION: .Other Dictation: Dictation Number [id number]  PLAN OF CARE: Discharge to home after PACU  PATIENT DISPOSITION:  PACU - hemodynamically stable.   Delay start of Pharmacological VTE agent (>24hrs) due to surgical blood loss or risk of bleeding: not applicable

## 2013-11-09 NOTE — Anesthesia Postprocedure Evaluation (Signed)
  Anesthesia Post-op Note  Patient: Kimberly Arnold  Procedure(s) Performed: Procedure(s): REPLACEMENT OF LEFT BREAST IMPLANT CAPSULECTOMY RELEASE OF SCAR TISSUE  (Left)  Patient Location: PACU  Anesthesia Type:General  Level of Consciousness: awake, alert , oriented and patient cooperative  Airway and Oxygen Therapy: Patient Spontanous Breathing  Post-op Pain: none  Post-op Assessment: Post-op Vital signs reviewed, Patient's Cardiovascular Status Stable, Respiratory Function Stable, Patent Airway, No signs of Nausea or vomiting and Pain level controlled  Post-op Vital Signs: Reviewed and stable  Last Vitals:  Filed Vitals:   11/09/13 1000  BP: 131/76  Pulse: 94  Temp:   Resp: 18    Complications: No apparent anesthesia complications

## 2013-11-09 NOTE — Op Note (Signed)
NAME:  [name], [name] NO.:  [id number]  MEDICAL RECORD NO.:  [id number]  LOCATION:                                 FACILITY:  PHYSICIAN:  Berneta Sages L. Towanda Malkin, M.D.DATE OF BIRTH:  July 29, 1950  DATE OF PROCEDURE:  11/09/2013 DATE OF DISCHARGE:  11/09/2013                              OPERATIVE REPORT   INDICATIONS:  A 63 year old lady who approximately 25 years ago had reconstruction of left breast for breast cancer.  She had an implant placed in that was a combination of saline and gel over the last few days; however, the area has reduced in size, deflated tremendously and over the weekend, she has developed increased firmness of the area.  PROCEDURES PLANNED:  Exploration of left breast, chest reconstructed areas, replacement of implant with new implant, possible capsulectomy.  DESCRIPTION OF PROCEDURE:  The patient was taken to the operating room, after she underwent drawings for the midline inframammary fold areas. She underwent general anesthesia, intubated orally, prep was done to the chest and breast areas.  Using Hibiclens soap and solution, walled off with sterile towels and drapes so as to make a sterile field and 0.5% Xylocaine with epinephrine injected locally in the horizontal incision area of left chest.  The incision was opened with #15 blade down to skin and subcutaneous tissue to underlying pectoralis major muscle.  Muscles divided in direction of its fibers and it was obvious the patient had firm, dense capsular formations.  Total capsulectomy was done 360 degrees, removing thickened skin, also within the pocket were tissues and residue that looked almost like mud and had some clumps and some of these were removed and sent to Pathology for examination.  Some of the fluid that was present which was dark sanguinous fluid, approximately 300 mL was suctioned out.  This was sent to Pathology for C and S as well as cytology and all the capsule  formation that was taken out, which was then sent for evaluation.  After this, I examined the area, palpated the area showing no evidence of any mass effects.  Irrigation approximately 2000 mL of saline to the area and after that we reconstructed the area with another implant, Mentor smooth moderate profile saline implant, style 1600.  We filled it up to 550 mL with good symmetry as compared to the right side.  After this, the muscle was then repaired back with 2-0 Monocryl, subcutaneous tissue and breast tissue with 2-0 Monocryl and then, the skin edges reapproximated with a running subcuticular stitch of 3-0 Monocryl.  Steri-Strips and soft dressing applied to all areas.  She withstood the procedures very well. Estimated blood loss, less than 50 mL.  Complications, none.     Odella Aquas. Towanda Malkin, M.D.     Elie Confer  D:  11/09/2013  T:  11/09/2013  Job:  [id number]

## 2013-11-09 NOTE — Anesthesia Procedure Notes (Signed)
Procedure Name: Intubation Performed by: Terrance Mass Pre-anesthesia Checklist: Patient identified, Emergency Drugs available, Suction available, Patient being monitored and Timeout performed Patient Re-evaluated:Patient Re-evaluated prior to inductionOxygen Delivery Method: Circle system utilized Preoxygenation: Pre-oxygenation with 100% oxygen Intubation Type: IV induction Ventilation: Mask ventilation without difficulty Laryngoscope Size: Miller and 2 Grade View: Grade II Tube type: Oral Tube size: 7.0 mm Number of attempts: 1 Placement Confirmation: ETT inserted through vocal cords under direct vision,  breath sounds checked- equal and bilateral and positive ETCO2 Secured at: 22 cm Tube secured with: Tape Dental Injury: Teeth and Oropharynx as per pre-operative assessment

## 2013-11-09 NOTE — Discharge Instructions (Signed)
Activity (include date of return to work if known) As tolerated: NO showers NO driving No heavy activities  Diet:regular No restrictions:  Wound Care: Keep dressing clean & dry  Do not change dressings Special Instructions: Do not raise arms up Continue to empty, recharge, & record drainage from J-P drains &/or Hemovacs 2-3 times a day, as needed. Call Doctor if any unusual problems occur such as pain, excessive Bleeding, unrelieved Nausea/vomiting, Fever &/or chills When lying down, keep head elevated on 2-3 pillows or back-rest  Follow-up appointment: Please call the office.  The patient received discharge instruction from:___________________________________________   Patient signature ________________________________________ / Date___________    Signature of individual providing instructions/ Date________________       Post Anesthesia Home Care Instructions  Activity: Get plenty of rest for the remainder of the day. A responsible adult should stay with you for 24 hours following the procedure.  For the next 24 hours, DO NOT: -Drive a car -Paediatric nurse -Drink alcoholic beverages -Take any medication unless instructed by your physician -Make any legal decisions or sign important papers.  Meals: Start with liquid foods such as gelatin or soup. Progress to regular foods as tolerated. Avoid greasy, spicy, heavy foods. If nausea and/or vomiting occur, drink only clear liquids until the nausea and/or vomiting subsides. Call your physician if vomiting continues.  Special Instructions/Symptoms: Your throat may feel dry or sore from the anesthesia or the breathing tube placed in your throat during surgery. If this causes discomfort, gargle with warm salt water. The discomfort should disappear within 24 hours.

## 2013-11-09 NOTE — H&P (Signed)
Kimberly Arnold is an 63 y.o. female.   Chief Complaint: Deflation of left implant HPI: SP cancer of the left breast over 20 yrs ago  Now has deflation total on the left side  Past Medical History  Diagnosis Date  . Hypertension   . Cancer     lt breast  . Wears glasses   . Osteopenia   . Seasonal allergies     Past Surgical History  Procedure Laterality Date  . Shoulder arthroscopy w/ rotator cuff repair  2008    left  . Simple mastectomy  1989    left  . Breast reconstruction  1989    left implant after mastectomy  . Colonoscopy    . Dilation and curettage of uterus    . Tubal ligation      History reviewed. No pertinent family history. Social History:  reports that she has never smoked. She does not have any smokeless tobacco history on file. She reports that she drinks alcohol. She reports that she does not use illicit drugs.  Allergies:  Allergies  Allergen Reactions  . Ace Inhibitors   . Beta Adrenergic Blockers   . Hctz [Hydrochlorothiazide]   . Tetracyclines & Related Rash  . Tramadol Rash    Medications Prior to Admission  Medication Sig Dispense Refill  . amLODipine (NORVASC) 10 MG tablet Take 10 mg by mouth daily.      Marland Kitchen aspirin 81 MG tablet Take 81 mg by mouth daily.      . cholecalciferol (VITAMIN D) 1000 UNITS tablet Take 1,000 Units by mouth daily.      . fluticasone (FLONASE) 50 MCG/ACT nasal spray Place into both nostrils as needed for allergies or rhinitis.      Marland Kitchen loratadine (CLARITIN) 10 MG tablet Take 10 mg by mouth as needed for allergies.      Marland Kitchen losartan (COZAAR) 100 MG tablet Take 100 mg by mouth daily.      . Multiple Vitamins-Minerals (MULTIVITAMIN WITH MINERALS) tablet Take 1 tablet by mouth daily.        No results found for this or any previous visit (from the past 48 hour(s)). No results found.  Review of Systems  Constitutional: Negative.   HENT: Negative.   Eyes: Negative.   Respiratory: Negative.   Cardiovascular: Negative.    Gastrointestinal: Negative.   Genitourinary: Negative.   Musculoskeletal: Negative.   Skin: Negative.   Neurological: Negative.   Endo/Heme/Allergies: Negative.   Psychiatric/Behavioral: Negative.     Blood pressure 158/101, pulse 90, temperature 98.4 F (36.9 C), temperature source Oral, resp. rate 20, height 5' 1.5" (1.562 m), weight 77.678 kg (171 lb 4 oz), SpO2 97.00%. Physical Exam   Assessment/Plan Deflation left implant that was placed for reconstruction  For replacement after removal  Kimberly Arnold 11/09/2013, 7:21 AM

## 2013-11-09 NOTE — Anesthesia Preprocedure Evaluation (Addendum)
Anesthesia Evaluation  Patient identified by MRN, date of birth, ID band Patient awake    Reviewed: Allergy & Precautions, H&P , NPO status , Patient's Chart, lab work & pertinent test results  History of Anesthesia Complications Negative for: history of anesthetic complications  Airway Mallampati: II TM Distance: >3 FB Neck ROM: Full    Dental  (+) Dental Advisory Given, Teeth Intact   Pulmonary Recent URI ,  breath sounds clear to auscultation        Cardiovascular hypertension, Pt. on medications - anginaRhythm:Regular Rate:Normal + Systolic murmurs (III/VI)    Neuro/Psych negative neurological ROS     GI/Hepatic negative GI ROS, Neg liver ROS,   Endo/Other  Morbid obesity  Renal/GU negative Renal ROS     Musculoskeletal   Abdominal   Peds  Hematology   Anesthesia Other Findings Breast cancer  Reproductive/Obstetrics                         Anesthesia Physical Anesthesia Plan  ASA: II  Anesthesia Plan: General   Post-op Pain Management:    Induction: Intravenous  Airway Management Planned: Oral ETT  Additional Equipment:   Intra-op Plan:   Post-operative Plan: Extubation in OR  Informed Consent: I have reviewed the patients History and Physical, chart, labs and discussed the procedure including the risks, benefits and alternatives for the proposed anesthesia with the patient or authorized representative who has indicated his/her understanding and acceptance.   Dental advisory given  Plan Discussed with: CRNA and Surgeon  Anesthesia Plan Comments: (Plan routine monitors, GETA Dr. Towanda Malkin and patient agree to F/U murmur with Dr. Laurann Montana)       Anesthesia Quick Evaluation

## 2013-11-09 NOTE — Transfer of Care (Signed)
Immediate Anesthesia Transfer of Care Note  Patient: Kimberly Arnold  Procedure(s) Performed: Procedure(s): REPLACEMENT OF LEFT BREAST IMPLANT CAPSULECTOMY RELEASE OF SCAR TISSUE  (Left)  Patient Location: PACU  Anesthesia Type:General  Level of Consciousness: awake and sedated  Airway & Oxygen Therapy: Patient Spontanous Breathing and Patient connected to face mask oxygen  Post-op Assessment: Report given to PACU RN and Post -op Vital signs reviewed and stable  Post vital signs: Reviewed and stable  Complications: No apparent anesthesia complications

## 2013-11-11 ENCOUNTER — Encounter (HOSPITAL_BASED_OUTPATIENT_CLINIC_OR_DEPARTMENT_OTHER): Payer: Self-pay | Admitting: Specialist

## 2013-11-11 LAB — WOUND CULTURE
Culture: NO GROWTH
Gram Stain: NONE SEEN

## 2013-11-14 LAB — ANAEROBIC CULTURE: GRAM STAIN: NONE SEEN

## 2014-01-13 ENCOUNTER — Other Ambulatory Visit (HOSPITAL_COMMUNITY): Payer: Self-pay | Admitting: Family Medicine

## 2014-01-13 ENCOUNTER — Ambulatory Visit (HOSPITAL_COMMUNITY): Payer: Medicare HMO | Attending: Cardiovascular Disease | Admitting: Cardiology

## 2014-01-13 DIAGNOSIS — R011 Cardiac murmur, unspecified: Secondary | ICD-10-CM

## 2014-01-13 DIAGNOSIS — I359 Nonrheumatic aortic valve disorder, unspecified: Secondary | ICD-10-CM

## 2014-01-13 NOTE — Progress Notes (Signed)
Echo performed. 

## 2014-08-12 ENCOUNTER — Other Ambulatory Visit: Payer: Self-pay

## 2014-08-12 DIAGNOSIS — Z1231 Encounter for screening mammogram for malignant neoplasm of breast: Secondary | ICD-10-CM

## 2014-08-12 DIAGNOSIS — H5210 Myopia, unspecified eye: Secondary | ICD-10-CM | POA: Diagnosis not present

## 2014-08-12 DIAGNOSIS — H524 Presbyopia: Secondary | ICD-10-CM | POA: Diagnosis not present

## 2014-08-31 ENCOUNTER — Ambulatory Visit
Admission: RE | Admit: 2014-08-31 | Discharge: 2014-08-31 | Disposition: A | Discharge status: Home / Self Care | Payer: Commercial Managed Care - HMO | Source: Ambulatory Visit

## 2014-08-31 DIAGNOSIS — Z1231 Encounter for screening mammogram for malignant neoplasm of breast: Secondary | ICD-10-CM | POA: Diagnosis not present

## 2014-09-07 DIAGNOSIS — I359 Nonrheumatic aortic valve disorder, unspecified: Secondary | ICD-10-CM | POA: Diagnosis not present

## 2014-09-07 DIAGNOSIS — I10 Essential (primary) hypertension: Secondary | ICD-10-CM | POA: Diagnosis not present

## 2015-01-11 DIAGNOSIS — I10 Essential (primary) hypertension: Secondary | ICD-10-CM | POA: Diagnosis not present

## 2015-01-11 DIAGNOSIS — M4800 Spinal stenosis, site unspecified: Secondary | ICD-10-CM | POA: Diagnosis not present

## 2015-01-11 DIAGNOSIS — J309 Allergic rhinitis, unspecified: Secondary | ICD-10-CM | POA: Diagnosis not present

## 2015-01-11 DIAGNOSIS — Z0000 Encounter for general adult medical examination without abnormal findings: Secondary | ICD-10-CM | POA: Diagnosis not present

## 2015-01-11 DIAGNOSIS — Z853 Personal history of malignant neoplasm of breast: Secondary | ICD-10-CM | POA: Diagnosis not present

## 2015-01-11 DIAGNOSIS — R030 Elevated blood-pressure reading, without diagnosis of hypertension: Secondary | ICD-10-CM | POA: Diagnosis not present

## 2015-01-11 DIAGNOSIS — I359 Nonrheumatic aortic valve disorder, unspecified: Secondary | ICD-10-CM | POA: Diagnosis not present

## 2015-01-11 DIAGNOSIS — M810 Age-related osteoporosis without current pathological fracture: Secondary | ICD-10-CM | POA: Diagnosis not present

## 2015-01-26 DIAGNOSIS — L905 Scar conditions and fibrosis of skin: Secondary | ICD-10-CM | POA: Diagnosis not present

## 2015-01-26 DIAGNOSIS — C50011 Malignant neoplasm of nipple and areola, right female breast: Secondary | ICD-10-CM | POA: Diagnosis not present

## 2015-03-10 ENCOUNTER — Other Ambulatory Visit: Payer: Self-pay | Admitting: Gastroenterology

## 2015-04-26 DIAGNOSIS — Z23 Encounter for immunization: Secondary | ICD-10-CM | POA: Diagnosis not present

## 2015-05-20 ENCOUNTER — Encounter (HOSPITAL_COMMUNITY): Payer: Self-pay | Admitting: *Deleted

## 2015-05-30 ENCOUNTER — Ambulatory Visit (HOSPITAL_COMMUNITY)
Admission: RE | Admit: 2015-05-30 | Discharge: 2015-05-30 | Disposition: A | Discharge status: Home / Self Care | Payer: Commercial Managed Care - HMO | Source: Ambulatory Visit | Attending: Gastroenterology | Admitting: Gastroenterology

## 2015-05-30 ENCOUNTER — Ambulatory Visit (HOSPITAL_COMMUNITY): Payer: Commercial Managed Care - HMO | Admitting: Anesthesiology

## 2015-05-30 ENCOUNTER — Encounter (HOSPITAL_COMMUNITY)
Admission: RE | Disposition: A | Discharge status: Home / Self Care | Payer: Self-pay | Source: Ambulatory Visit | Attending: Gastroenterology

## 2015-05-30 ENCOUNTER — Encounter (HOSPITAL_COMMUNITY): Payer: Self-pay

## 2015-05-30 DIAGNOSIS — I10 Essential (primary) hypertension: Secondary | ICD-10-CM | POA: Insufficient documentation

## 2015-05-30 DIAGNOSIS — Z853 Personal history of malignant neoplasm of breast: Secondary | ICD-10-CM | POA: Diagnosis not present

## 2015-05-30 DIAGNOSIS — K5730 Diverticulosis of large intestine without perforation or abscess without bleeding: Secondary | ICD-10-CM | POA: Diagnosis not present

## 2015-05-30 DIAGNOSIS — Z1211 Encounter for screening for malignant neoplasm of colon: Secondary | ICD-10-CM | POA: Diagnosis not present

## 2015-05-30 DIAGNOSIS — K5790 Diverticulosis of intestine, part unspecified, without perforation or abscess without bleeding: Secondary | ICD-10-CM | POA: Diagnosis not present

## 2015-05-30 HISTORY — DX: Cardiac murmur, unspecified: R01.1

## 2015-05-30 HISTORY — PX: COLONOSCOPY WITH PROPOFOL: SHX5780

## 2015-05-30 HISTORY — DX: Spinal stenosis, site unspecified: M48.00

## 2015-05-30 SURGERY — COLONOSCOPY WITH PROPOFOL
Anesthesia: Monitor Anesthesia Care

## 2015-05-30 MED ORDER — LACTATED RINGERS IV SOLN
INTRAVENOUS | Status: DC
  Administered 2015-05-30: 1000 mL via INTRAVENOUS

## 2015-05-30 MED ORDER — PROPOFOL 500 MG/50ML IV EMUL
INTRAVENOUS | Status: DC | PRN
  Administered 2015-05-30: 120 ug/kg/min via INTRAVENOUS

## 2015-05-30 MED ORDER — PROPOFOL 10 MG/ML IV BOLUS
INTRAVENOUS | Status: AC
  Filled 2015-05-30: qty 40

## 2015-05-30 MED ORDER — LIDOCAINE HCL (CARDIAC) 20 MG/ML IV SOLN
INTRAVENOUS | Status: AC
  Filled 2015-05-30: qty 5

## 2015-05-30 MED ORDER — SODIUM CHLORIDE 0.9 % IV SOLN: INTRAVENOUS | Status: DC

## 2015-05-30 MED ORDER — PROPOFOL 500 MG/50ML IV EMUL
INTRAVENOUS | Status: DC | PRN
  Administered 2015-05-30: 40 mg via INTRAVENOUS

## 2015-05-30 SURGICAL SUPPLY — 21 items

## 2015-05-30 NOTE — Transfer of Care (Signed)
Immediate Anesthesia Transfer of Care Note  Patient: Kimberly Arnold  Procedure(s) Performed: Procedure(s): COLONOSCOPY WITH PROPOFOL (N/A)  Patient Location: PACU  Anesthesia Type:MAC  Level of Consciousness: awake, alert  and oriented  Airway & Oxygen Therapy: Patient Spontanous Breathing and Patient connected to face mask oxygen  Post-op Assessment: Report given to RN and Post -op Vital signs reviewed and stable  Post vital signs: Reviewed and stable  Last Vitals:  Filed Vitals:   05/30/15 0749  BP: 179/109  Pulse: 70  Temp: 36.6 C  Resp: 16    Complications: No apparent anesthesia complications

## 2015-05-30 NOTE — Anesthesia Postprocedure Evaluation (Signed)
Anesthesia Post Note  Patient: Kimberly Arnold  Procedure(s) Performed: Procedure(s) (LRB): COLONOSCOPY WITH PROPOFOL (N/A)  Patient location during evaluation: PACU Anesthesia Type: MAC Level of consciousness: awake and alert Pain management: pain level controlled Vital Signs Assessment: post-procedure vital signs reviewed and stable Respiratory status: spontaneous breathing, nonlabored ventilation, respiratory function stable and patient connected to nasal cannula oxygen Cardiovascular status: stable and blood pressure returned to baseline Anesthetic complications: no    Last Vitals:  Filed Vitals:   05/30/15 0958 05/30/15 1000  BP: 151/87 151/87  Pulse: 57 57  Temp:    Resp: 19 19    Last Pain:  Filed Vitals:   05/30/15 1001  PainSc: 2                  Effie Berkshire

## 2015-05-30 NOTE — H&P (Signed)
  Procedure: Screening colonoscopy. Normal screening colonoscopy performed on 09/07/2004  History: The patient is a 64 year old female born 12/27/1950. She is scheduled to undergo a repeat screening colonoscopy today.  Past medical history: Hypertension. Breast cancer. Vitamin D deficiency. Bilateral rotator cuff surgeries. Mastectomy for breast cancer. Bilateral tubal ligation. D&C with uterine polypectomy.  Medication allergies: Keflex causes rash. Hydrochlorothiazide caused hypercalcemia. ACE inhibitors cause cough. Hydrocodone cause nausea. Metoprolol cause fatigue.  Exam: The patient is alert and lying comfortably on the endoscopy stretcher. Abdomen is soft and nontender to palpation. Cardiac exam reveals a regular rhythm. Lungs are clear to auscultation.  Plan: Proceed with screening colonoscopy

## 2015-05-30 NOTE — Discharge Instructions (Signed)

## 2015-05-30 NOTE — Anesthesia Preprocedure Evaluation (Addendum)
Anesthesia Evaluation  Patient identified by MRN, date of birth, ID band Patient awake    Reviewed: Allergy & Precautions, NPO status , Patient's Chart, lab work & pertinent test results  Airway Mallampati: II  TM Distance: >3 FB Neck ROM: Full    Dental  (+) Teeth Intact   Pulmonary neg pulmonary ROS,    breath sounds clear to auscultation       Cardiovascular hypertension, Pt. on medications  Rhythm:Regular Rate:Normal     Neuro/Psych negative neurological ROS  negative psych ROS   GI/Hepatic negative GI ROS, Neg liver ROS,   Endo/Other  negative endocrine ROS  Renal/GU negative Renal ROS  negative genitourinary   Musculoskeletal negative musculoskeletal ROS (+)   Abdominal   Peds negative pediatric ROS (+)  Hematology negative hematology ROS (+)   Anesthesia Other Findings   Reproductive/Obstetrics negative OB ROS                            Lab Results  Component Value Date   WBC 5.4 03/21/2007   HGB 19.1* 11/09/2013   HCT 40.8 03/21/2007   MCV 81.5 03/21/2007   PLT 221 03/21/2007   Lab Results  Component Value Date   CREATININE 0.81 11/05/2013   BUN 13 11/05/2013   NA 144 11/05/2013   K 3.8 11/05/2013   CL 106 11/05/2013   CO2 26 11/05/2013   Lab Results  Component Value Date   INR 1.0 03/21/2007     Anesthesia Physical Anesthesia Plan  ASA: II  Anesthesia Plan: MAC   Post-op Pain Management:    Induction: Intravenous  Airway Management Planned: Natural Airway and Nasal Cannula  Additional Equipment:   Intra-op Plan:   Post-operative Plan:   Informed Consent: I have reviewed the patients History and Physical, chart, labs and discussed the procedure including the risks, benefits and alternatives for the proposed anesthesia with the patient or authorized representative who has indicated his/her understanding and acceptance.     Plan Discussed with:  CRNA  Anesthesia Plan Comments:         Anesthesia Quick Evaluation

## 2015-05-30 NOTE — Op Note (Signed)
Procedure: Screening colonoscopy. Normal screening colonoscopy performed on 09/07/2004  Endoscopist: Earle Gell  Premedication: Propofol administered by anesthesia  Procedure: The patient was placed in the left lateral decubitus position. Anal inspection and digital rectal exam were normal. The Pentax pediatric colonoscope was introduced into the rectum and advanced to the cecum. A normal-appearing appendiceal orifice and ileocecal valve were identified. Colonic preparation for the exam today was good. Withdrawal time was 9 minutes  Universal colonic diverticulosis was present  Rectum. Normal. Retroflexed view of the distal rectum was normal  Sigmoid colon and descending colon. Normal  Splenic flexure. Normal  Transverse colon. Normal  Hepatic flexure. Normal  Ascending colon. Normal  Cecum and ileocecal valve. Normal  Assessment: Normal screening colonoscopy  Recommendation: Schedule repeat screening colonoscopy in 10 years

## 2015-05-31 ENCOUNTER — Encounter (HOSPITAL_COMMUNITY): Payer: Self-pay | Admitting: Gastroenterology

## 2015-07-14 DIAGNOSIS — I10 Essential (primary) hypertension: Secondary | ICD-10-CM | POA: Diagnosis not present

## 2015-07-14 DIAGNOSIS — E559 Vitamin D deficiency, unspecified: Secondary | ICD-10-CM | POA: Diagnosis not present

## 2015-07-28 ENCOUNTER — Other Ambulatory Visit: Payer: Self-pay

## 2015-07-28 DIAGNOSIS — Z1231 Encounter for screening mammogram for malignant neoplasm of breast: Secondary | ICD-10-CM

## 2015-07-28 DIAGNOSIS — Z9012 Acquired absence of left breast and nipple: Secondary | ICD-10-CM

## 2015-08-04 DIAGNOSIS — H524 Presbyopia: Secondary | ICD-10-CM | POA: Diagnosis not present

## 2015-08-04 DIAGNOSIS — H5210 Myopia, unspecified eye: Secondary | ICD-10-CM | POA: Diagnosis not present

## 2015-09-01 ENCOUNTER — Ambulatory Visit
Admission: RE | Admit: 2015-09-01 | Discharge: 2015-09-01 | Disposition: A | Discharge status: Home / Self Care | Payer: Commercial Managed Care - HMO | Source: Ambulatory Visit

## 2015-09-01 DIAGNOSIS — Z9012 Acquired absence of left breast and nipple: Secondary | ICD-10-CM

## 2015-09-01 DIAGNOSIS — Z1231 Encounter for screening mammogram for malignant neoplasm of breast: Secondary | ICD-10-CM

## 2016-01-23 ENCOUNTER — Other Ambulatory Visit: Payer: Self-pay | Admitting: Family Medicine

## 2016-01-23 DIAGNOSIS — Z124 Encounter for screening for malignant neoplasm of cervix: Secondary | ICD-10-CM | POA: Diagnosis not present

## 2016-01-23 DIAGNOSIS — J309 Allergic rhinitis, unspecified: Secondary | ICD-10-CM | POA: Diagnosis not present

## 2016-01-23 DIAGNOSIS — I359 Nonrheumatic aortic valve disorder, unspecified: Secondary | ICD-10-CM

## 2016-01-23 DIAGNOSIS — I10 Essential (primary) hypertension: Secondary | ICD-10-CM | POA: Diagnosis not present

## 2016-01-23 DIAGNOSIS — M810 Age-related osteoporosis without current pathological fracture: Secondary | ICD-10-CM | POA: Diagnosis not present

## 2016-01-23 DIAGNOSIS — Z0000 Encounter for general adult medical examination without abnormal findings: Secondary | ICD-10-CM | POA: Diagnosis not present

## 2016-01-23 DIAGNOSIS — Z853 Personal history of malignant neoplasm of breast: Secondary | ICD-10-CM | POA: Diagnosis not present

## 2016-02-01 ENCOUNTER — Ambulatory Visit (HOSPITAL_COMMUNITY): Payer: Commercial Managed Care - HMO | Attending: Cardiology

## 2016-02-01 ENCOUNTER — Encounter (INDEPENDENT_AMBULATORY_CARE_PROVIDER_SITE_OTHER): Payer: Self-pay

## 2016-02-01 ENCOUNTER — Other Ambulatory Visit: Payer: Self-pay

## 2016-02-01 DIAGNOSIS — I340 Nonrheumatic mitral (valve) insufficiency: Secondary | ICD-10-CM | POA: Insufficient documentation

## 2016-02-01 DIAGNOSIS — I359 Nonrheumatic aortic valve disorder, unspecified: Secondary | ICD-10-CM | POA: Diagnosis not present

## 2016-02-01 DIAGNOSIS — I119 Hypertensive heart disease without heart failure: Secondary | ICD-10-CM | POA: Insufficient documentation

## 2016-02-01 DIAGNOSIS — I351 Nonrheumatic aortic (valve) insufficiency: Secondary | ICD-10-CM | POA: Diagnosis not present

## 2016-02-01 LAB — ECHOCARDIOGRAM COMPLETE
AVPHT: 648 ms
Ao-asc: 34 cm
CHL CUP DOP CALC LVOT VTI: 24.2 cm
CHL CUP MV DEC (S): 317
CHL CUP STROKE VOLUME: 45 mL
CHL CUP TV REG PEAK VELOCITY: 285 cm/s
E decel time: 317 msec
EERAT: 9.17
FS: 36 % (ref 28–44)
IVS/LV PW RATIO, ED: 1.1
LA ID, A-P, ES: 41 mm
LA diam index: 2.32 cm/m2
LA vol index: 34.5 mL/m2
LA vol: 61 mL
LAVOLA4C: 59 mL
LEFT ATRIUM END SYS DIAM: 41 mm
LV PW d: 12.1 mm — AB (ref 0.6–1.1)
LV SIMPSON'S DISK: 69
LV dias vol index: 37 mL/m2
LV dias vol: 65 mL (ref 46–106)
LV e' LATERAL: 6.95 cm/s
LV sys vol index: 11 mL/m2
LV sys vol: 20 mL (ref 14–42)
LVEEAVG: 9.17
LVEEMED: 9.17
LVOT area: 2.27 cm2
LVOT peak grad rest: 4 mmHg
LVOT peak vel: 95.5 cm/s
LVOTD: 17 mm
LVOTSV: 55 mL
Lateral S' vel: 18.5 cm/s
MV pk E vel: 63.7 m/s
MVPKAVEL: 104 m/s
RV sys press: 35 mmHg
TDI e' lateral: 6.95
TDI e' medial: 8.22
TRMAXVEL: 285 cm/s

## 2016-02-02 DIAGNOSIS — M8588 Other specified disorders of bone density and structure, other site: Secondary | ICD-10-CM | POA: Diagnosis not present

## 2016-02-02 DIAGNOSIS — M810 Age-related osteoporosis without current pathological fracture: Secondary | ICD-10-CM | POA: Diagnosis not present

## 2016-07-25 ENCOUNTER — Other Ambulatory Visit: Payer: Self-pay | Admitting: Family Medicine

## 2016-07-25 DIAGNOSIS — M436 Torticollis: Secondary | ICD-10-CM | POA: Diagnosis not present

## 2016-07-25 DIAGNOSIS — I10 Essential (primary) hypertension: Secondary | ICD-10-CM | POA: Diagnosis not present

## 2016-07-25 DIAGNOSIS — Z1231 Encounter for screening mammogram for malignant neoplasm of breast: Secondary | ICD-10-CM

## 2016-07-25 DIAGNOSIS — I359 Nonrheumatic aortic valve disorder, unspecified: Secondary | ICD-10-CM | POA: Diagnosis not present

## 2016-07-30 DIAGNOSIS — C50912 Malignant neoplasm of unspecified site of left female breast: Secondary | ICD-10-CM | POA: Diagnosis not present

## 2016-07-31 DIAGNOSIS — H524 Presbyopia: Secondary | ICD-10-CM | POA: Diagnosis not present

## 2016-08-24 DIAGNOSIS — C50912 Malignant neoplasm of unspecified site of left female breast: Secondary | ICD-10-CM | POA: Diagnosis not present

## 2016-09-03 ENCOUNTER — Ambulatory Visit
Admission: RE | Admit: 2016-09-03 | Discharge: 2016-09-03 | Disposition: A | Discharge status: Home / Self Care | Payer: Medicare HMO | Source: Ambulatory Visit | Attending: Family Medicine | Admitting: Family Medicine

## 2016-09-03 DIAGNOSIS — Z1231 Encounter for screening mammogram for malignant neoplasm of breast: Secondary | ICD-10-CM

## 2017-01-31 DIAGNOSIS — Z853 Personal history of malignant neoplasm of breast: Secondary | ICD-10-CM | POA: Diagnosis not present

## 2017-01-31 DIAGNOSIS — M810 Age-related osteoporosis without current pathological fracture: Secondary | ICD-10-CM | POA: Diagnosis not present

## 2017-01-31 DIAGNOSIS — J309 Allergic rhinitis, unspecified: Secondary | ICD-10-CM | POA: Diagnosis not present

## 2017-01-31 DIAGNOSIS — I359 Nonrheumatic aortic valve disorder, unspecified: Secondary | ICD-10-CM | POA: Diagnosis not present

## 2017-01-31 DIAGNOSIS — I10 Essential (primary) hypertension: Secondary | ICD-10-CM | POA: Diagnosis not present

## 2017-01-31 DIAGNOSIS — Z0000 Encounter for general adult medical examination without abnormal findings: Secondary | ICD-10-CM | POA: Diagnosis not present

## 2017-06-27 DIAGNOSIS — H01115 Allergic dermatitis of left lower eyelid: Secondary | ICD-10-CM | POA: Diagnosis not present

## 2017-06-27 DIAGNOSIS — H01114 Allergic dermatitis of left upper eyelid: Secondary | ICD-10-CM | POA: Diagnosis not present

## 2017-06-27 DIAGNOSIS — H01111 Allergic dermatitis of right upper eyelid: Secondary | ICD-10-CM | POA: Diagnosis not present

## 2017-06-27 DIAGNOSIS — H01112 Allergic dermatitis of right lower eyelid: Secondary | ICD-10-CM | POA: Diagnosis not present

## 2017-08-05 DIAGNOSIS — I10 Essential (primary) hypertension: Secondary | ICD-10-CM | POA: Diagnosis not present

## 2017-08-05 DIAGNOSIS — E559 Vitamin D deficiency, unspecified: Secondary | ICD-10-CM | POA: Diagnosis not present

## 2017-08-12 ENCOUNTER — Other Ambulatory Visit: Payer: Self-pay | Admitting: Family Medicine

## 2017-08-12 DIAGNOSIS — Z1231 Encounter for screening mammogram for malignant neoplasm of breast: Secondary | ICD-10-CM

## 2017-08-23 DIAGNOSIS — H524 Presbyopia: Secondary | ICD-10-CM | POA: Diagnosis not present

## 2017-08-30 DIAGNOSIS — R509 Fever, unspecified: Secondary | ICD-10-CM | POA: Diagnosis not present

## 2017-08-30 DIAGNOSIS — J101 Influenza due to other identified influenza virus with other respiratory manifestations: Secondary | ICD-10-CM | POA: Diagnosis not present

## 2017-09-06 ENCOUNTER — Ambulatory Visit: Payer: Medicare HMO

## 2017-10-03 ENCOUNTER — Ambulatory Visit
Admission: RE | Admit: 2017-10-03 | Discharge: 2017-10-03 | Disposition: A | Discharge status: Home / Self Care | Payer: Medicare HMO | Source: Ambulatory Visit | Attending: Family Medicine | Admitting: Family Medicine

## 2017-10-03 DIAGNOSIS — Z1231 Encounter for screening mammogram for malignant neoplasm of breast: Secondary | ICD-10-CM

## 2017-10-21 DIAGNOSIS — Z9011 Acquired absence of right breast and nipple: Secondary | ICD-10-CM | POA: Diagnosis not present

## 2017-10-21 DIAGNOSIS — C50912 Malignant neoplasm of unspecified site of left female breast: Secondary | ICD-10-CM | POA: Diagnosis not present

## 2017-11-06 DIAGNOSIS — Z9011 Acquired absence of right breast and nipple: Secondary | ICD-10-CM | POA: Diagnosis not present

## 2017-11-06 DIAGNOSIS — C50912 Malignant neoplasm of unspecified site of left female breast: Secondary | ICD-10-CM | POA: Diagnosis not present

## 2018-03-07 ENCOUNTER — Other Ambulatory Visit (HOSPITAL_COMMUNITY): Payer: Self-pay | Admitting: Family Medicine

## 2018-03-07 DIAGNOSIS — Z0000 Encounter for general adult medical examination without abnormal findings: Secondary | ICD-10-CM | POA: Diagnosis not present

## 2018-03-07 DIAGNOSIS — I359 Nonrheumatic aortic valve disorder, unspecified: Secondary | ICD-10-CM | POA: Diagnosis not present

## 2018-03-07 DIAGNOSIS — Z23 Encounter for immunization: Secondary | ICD-10-CM | POA: Diagnosis not present

## 2018-03-07 DIAGNOSIS — M810 Age-related osteoporosis without current pathological fracture: Secondary | ICD-10-CM | POA: Diagnosis not present

## 2018-03-07 DIAGNOSIS — I10 Essential (primary) hypertension: Secondary | ICD-10-CM | POA: Diagnosis not present

## 2018-03-07 DIAGNOSIS — E559 Vitamin D deficiency, unspecified: Secondary | ICD-10-CM | POA: Diagnosis not present

## 2018-03-07 DIAGNOSIS — M4800 Spinal stenosis, site unspecified: Secondary | ICD-10-CM | POA: Diagnosis not present

## 2018-03-07 DIAGNOSIS — Z853 Personal history of malignant neoplasm of breast: Secondary | ICD-10-CM | POA: Diagnosis not present

## 2018-03-07 DIAGNOSIS — J309 Allergic rhinitis, unspecified: Secondary | ICD-10-CM | POA: Diagnosis not present

## 2018-03-10 ENCOUNTER — Other Ambulatory Visit: Payer: Self-pay

## 2018-03-10 ENCOUNTER — Encounter (INDEPENDENT_AMBULATORY_CARE_PROVIDER_SITE_OTHER): Payer: Self-pay

## 2018-03-10 ENCOUNTER — Ambulatory Visit (HOSPITAL_COMMUNITY): Payer: Medicare HMO | Attending: Cardiology

## 2018-03-10 DIAGNOSIS — R011 Cardiac murmur, unspecified: Secondary | ICD-10-CM | POA: Diagnosis not present

## 2018-03-10 DIAGNOSIS — Z853 Personal history of malignant neoplasm of breast: Secondary | ICD-10-CM | POA: Diagnosis not present

## 2018-03-10 DIAGNOSIS — I351 Nonrheumatic aortic (valve) insufficiency: Secondary | ICD-10-CM | POA: Diagnosis not present

## 2018-03-10 DIAGNOSIS — I10 Essential (primary) hypertension: Secondary | ICD-10-CM | POA: Diagnosis not present

## 2018-03-10 DIAGNOSIS — I359 Nonrheumatic aortic valve disorder, unspecified: Secondary | ICD-10-CM | POA: Diagnosis not present

## 2018-05-29 DIAGNOSIS — J019 Acute sinusitis, unspecified: Secondary | ICD-10-CM | POA: Diagnosis not present

## 2018-07-12 ENCOUNTER — Other Ambulatory Visit: Payer: Self-pay

## 2018-07-12 ENCOUNTER — Encounter (HOSPITAL_COMMUNITY): Payer: Self-pay | Admitting: Emergency Medicine

## 2018-07-12 ENCOUNTER — Ambulatory Visit (HOSPITAL_COMMUNITY)
Admission: EM | Admit: 2018-07-12 | Discharge: 2018-07-12 | Disposition: A | Discharge status: Home / Self Care | Payer: Medicare HMO | Attending: Family Medicine | Admitting: Family Medicine

## 2018-07-12 DIAGNOSIS — T7840XA Allergy, unspecified, initial encounter: Secondary | ICD-10-CM

## 2018-07-12 MED ORDER — MOMETASONE FUROATE 0.1 % EX CREA: 1.0000 "application " | TOPICAL_CREAM | Freq: Every day | CUTANEOUS | 0 refills | Status: DC

## 2018-07-12 NOTE — ED Triage Notes (Signed)
Patient reports bilateral eye lid swelling started Sunday, eye swelling has worsened everyday since then.    No new medicines.  Took mucinex one week prior to swelling starting.    Eyes are itching.  Denies difficulty swallowing, no difficulty breathing

## 2018-07-12 NOTE — Discharge Instructions (Addendum)
Apply cool compresses 3-4 times a day Be careful about hands going to face Remove any nail polish Also take antihistamine such as Zyrtec or Xyzal once daily

## 2018-07-12 NOTE — ED Provider Notes (Signed)
[facility]    CSN: [id number] Arrival date & time: 07/12/18  1037     History   Chief Complaint Chief Complaint  Patient presents with  . Angioedema    HPI Kimberly Arnold is a 68 y.o. female.   Patient complains of swelling of eyelids and tissues underneath the eye.  She denies any swelling of lips tongue throat no difficulty swallowing.  She had manicure done several days prior to the onset.  HPI  Past Medical History:  Diagnosis Date  . Cancer [facility])    lt breast-5 yrs ago (surgery only)-Dr. Murinson-released from care '01  . Heart murmur   . Hypertension   . Osteopenia   . Seasonal allergies   . Spinal stenosis   . Wears glasses     There are no active problems to display for this patient.   Past Surgical History:  Procedure Laterality Date  . BREAST CAPSULECTOMY WITH IMPLANT EXCHANGE Left 11/09/2013   Procedure: REPLACEMENT OF LEFT BREAST IMPLANT CAPSULECTOMY RELEASE OF SCAR TISSUE ;  Surgeon: [name], MD;  Location: [place];  Service: Plastics;  Laterality: Left;  . BREAST RECONSTRUCTION  1989   left implant after mastectomy  . COLONOSCOPY    . COLONOSCOPY WITH PROPOFOL N/A 05/30/2015   Procedure: COLONOSCOPY WITH PROPOFOL;  Surgeon: Garlan Fair, MD;  Location: [facility];  Service: Endoscopy;  Laterality: N/A;  . DILATION AND CURETTAGE OF UTERUS    . MASTECTOMY Left   . REDUCTION MAMMAPLASTY Right   . SHOULDER ARTHROSCOPY W/ ROTATOR CUFF REPAIR  2008   left  . SIMPLE MASTECTOMY  1989   left "modified radical masctectomy"-   . TUBAL LIGATION      OB History   No obstetric history on file.      Home Medications    Prior to Admission medications   Medication Sig Start Date End Date Taking? Authorizing Provider  amLODipine (NORVASC) 10 MG tablet Take 10 mg by mouth daily.   Yes [provider]  aspirin EC 81 MG tablet Take 81 mg by mouth daily.   Yes [provider]  cholecalciferol  (VITAMIN D) 1000 UNITS tablet Take 1,000 Units by mouth daily.   Yes [provider]  fluticasone (FLONASE) 50 MCG/ACT nasal spray Place 2 sprays into both nostrils daily as needed for allergies or rhinitis.    Yes [provider]  loratadine (CLARITIN) 10 MG tablet Take 10 mg by mouth daily as needed for allergies.    Yes [provider]  losartan (COZAAR) 100 MG tablet Take 100 mg by mouth daily.   Yes [provider]    Family History Family History  Problem Relation Age of Onset  . Breast cancer Neg Hx     Social History Social History   Tobacco Use  . Smoking status: Never Smoker  Substance Use Topics  . Alcohol use: Yes    Comment: occ. rare  . Drug use: No     Allergies   Ace inhibitors; Beta adrenergic blockers; Tetracyclines & related; and Tramadol   Review of Systems Review of Systems  Eyes: Positive for itching.     Physical Exam Triage Vital Signs ED Triage Vitals  Enc Vitals Group     BP 07/12/18 1202 (!) 157/81     Pulse Rate 07/12/18 1202 66     Resp 07/12/18 1202 20     Temp 07/12/18 1202 98 F (36.7 C)  Temp Source 07/12/18 1202 Temporal     SpO2 07/12/18 1202 98 %     Weight --      Height --      Head Circumference --      Peak Flow --      Pain Score 07/12/18 1159 0     Pain Loc --      Pain Edu? --      Excl. in [place]? --    No data found.  Updated Vital Signs BP (!) 157/81 (BP Location: Right Arm)   Pulse 66   Temp 98 F (36.7 C) (Temporal)   Resp 20   SpO2 98%   Visual Acuity Right Eye Distance:   Left Eye Distance:   Bilateral Distance:    Right Eye Near:   Left Eye Near:    Bilateral Near:     Physical Exam Constitutional:      Appearance: Normal appearance. She is normal weight.  HENT:     Mouth/Throat:     Mouth: Mucous membranes are moist.     Pharynx: Oropharynx is clear. No oropharyngeal exudate or posterior oropharyngeal erythema.     Comments: There is no swelling of the  tongue or uvula or lips Eyes:     Comments: There is swelling of both upper and lower lids bilaterally as well as the skin underneath eyes. There is no conjunctival erythema.  Neurological:     Mental Status: She is alert.      UC Treatments / Results  Labs (all labs ordered are listed, but only abnormal results are displayed) Labs Reviewed - No data to display  EKG None  Radiology No results found.  Procedures Procedures (including critical care time)  Medications Ordered in UC Medications - No data to display  Initial Impression / Assessment and Plan / UC Course  I have reviewed the triage vital signs and the nursing notes.  Pertinent labs & imaging results that were available during my care of the patient were reviewed by me and considered in my medical decision making (see chart for details).     Allergic reaction eyelids Final Clinical Impressions(s) / UC Diagnoses   Final diagnoses:  None   Discharge Instructions   None    ED Prescriptions    None     Controlled Substance Prescriptions [place] Controlled Substance Registry consulted? No   Wardell Honour, MD 07/12/18 [phone]

## 2018-08-27 ENCOUNTER — Other Ambulatory Visit: Payer: Self-pay | Admitting: Family Medicine

## 2018-08-27 DIAGNOSIS — Z1231 Encounter for screening mammogram for malignant neoplasm of breast: Secondary | ICD-10-CM

## 2018-08-29 DIAGNOSIS — H524 Presbyopia: Secondary | ICD-10-CM | POA: Diagnosis not present

## 2018-10-07 ENCOUNTER — Ambulatory Visit: Payer: Medicare HMO

## 2018-10-08 ENCOUNTER — Ambulatory Visit: Payer: Medicare HMO

## 2018-11-17 ENCOUNTER — Other Ambulatory Visit: Payer: Self-pay

## 2018-11-17 ENCOUNTER — Ambulatory Visit
Admission: RE | Admit: 2018-11-17 | Discharge: 2018-11-17 | Disposition: A | Discharge status: Home / Self Care | Payer: Medicare HMO | Source: Ambulatory Visit | Attending: Family Medicine | Admitting: Family Medicine

## 2018-11-17 DIAGNOSIS — Z1231 Encounter for screening mammogram for malignant neoplasm of breast: Secondary | ICD-10-CM | POA: Diagnosis not present

## 2019-04-20 DIAGNOSIS — M810 Age-related osteoporosis without current pathological fracture: Secondary | ICD-10-CM | POA: Diagnosis not present

## 2019-04-20 DIAGNOSIS — E559 Vitamin D deficiency, unspecified: Secondary | ICD-10-CM | POA: Diagnosis not present

## 2019-04-20 DIAGNOSIS — Z0000 Encounter for general adult medical examination without abnormal findings: Secondary | ICD-10-CM | POA: Diagnosis not present

## 2019-04-20 DIAGNOSIS — Z853 Personal history of malignant neoplasm of breast: Secondary | ICD-10-CM | POA: Diagnosis not present

## 2019-04-20 DIAGNOSIS — J309 Allergic rhinitis, unspecified: Secondary | ICD-10-CM | POA: Diagnosis not present

## 2019-04-20 DIAGNOSIS — Z1389 Encounter for screening for other disorder: Secondary | ICD-10-CM | POA: Diagnosis not present

## 2019-04-20 DIAGNOSIS — I10 Essential (primary) hypertension: Secondary | ICD-10-CM | POA: Diagnosis not present

## 2019-04-20 DIAGNOSIS — I359 Nonrheumatic aortic valve disorder, unspecified: Secondary | ICD-10-CM | POA: Diagnosis not present

## 2019-08-01 ENCOUNTER — Ambulatory Visit: Payer: Medicare HMO | Attending: Internal Medicine

## 2019-08-01 DIAGNOSIS — Z23 Encounter for immunization: Secondary | ICD-10-CM | POA: Insufficient documentation

## 2019-08-01 NOTE — Progress Notes (Signed)
   Covid-19 Vaccination Clinic  Name:  Kimberly Arnold    MRN: [id number] DOB: [birth year]  08/01/2019  [name] was observed post Covid-19 immunization for 15 minutes without incidence. She was provided with Vaccine Information Sheet and instruction to access the V-Safe system.   [name] was instructed to call 911 with any severe reactions post vaccine: Marland Kitchen Difficulty breathing  . Swelling of your face and throat  . A fast heartbeat  . A bad rash all over your body  . Dizziness and weakness    Immunizations Administered    Name Date Dose VIS Date Route   Pfizer COVID-19 Vaccine 08/01/2019  1:44 PM 0.3 mL 05/22/2019 Intramuscular   Manufacturer: [place]   Lot: [id number]   [place]: [id number]

## 2019-08-25 ENCOUNTER — Ambulatory Visit: Payer: Medicare HMO | Attending: Internal Medicine

## 2019-08-25 DIAGNOSIS — Z23 Encounter for immunization: Secondary | ICD-10-CM

## 2019-08-25 NOTE — Progress Notes (Signed)
   Covid-19 Vaccination Clinic  Name:  Kimberly Arnold    MRN: [id number] DOB: 12-18-1950  08/25/2019  Kimberly Arnold was observed post Covid-19 immunization for 15 minutes without incident. She was provided with Vaccine Information Sheet and instruction to access the V-Safe system.   Kimberly Arnold was instructed to call 911 with any severe reactions post vaccine: Marland Kitchen Difficulty breathing  . Swelling of face and throat  . A fast heartbeat  . A bad rash all over body  . Dizziness and weakness   Immunizations Administered    Name Date Dose VIS Date Route   Pfizer COVID-19 Vaccine 08/25/2019 11:10 AM 0.3 mL 05/22/2019 Intramuscular   Manufacturer: [place]   Lot: [id number]   [place]: [id number]

## 2019-08-31 DIAGNOSIS — H524 Presbyopia: Secondary | ICD-10-CM | POA: Diagnosis not present

## 2019-10-16 ENCOUNTER — Other Ambulatory Visit: Payer: Self-pay | Admitting: Family Medicine

## 2019-10-16 DIAGNOSIS — Z1231 Encounter for screening mammogram for malignant neoplasm of breast: Secondary | ICD-10-CM

## 2019-11-19 ENCOUNTER — Ambulatory Visit
Admission: RE | Admit: 2019-11-19 | Discharge: 2019-11-19 | Disposition: A | Discharge status: Home / Self Care | Payer: Medicare HMO | Source: Ambulatory Visit | Attending: Family Medicine | Admitting: Family Medicine

## 2019-11-19 ENCOUNTER — Other Ambulatory Visit: Payer: Self-pay

## 2019-11-19 DIAGNOSIS — Z1231 Encounter for screening mammogram for malignant neoplasm of breast: Secondary | ICD-10-CM | POA: Diagnosis not present

## 2020-04-26 ENCOUNTER — Other Ambulatory Visit: Payer: Self-pay | Admitting: Family Medicine

## 2020-04-26 DIAGNOSIS — E559 Vitamin D deficiency, unspecified: Secondary | ICD-10-CM | POA: Diagnosis not present

## 2020-04-26 DIAGNOSIS — Z23 Encounter for immunization: Secondary | ICD-10-CM | POA: Diagnosis not present

## 2020-04-26 DIAGNOSIS — Z0000 Encounter for general adult medical examination without abnormal findings: Secondary | ICD-10-CM | POA: Diagnosis not present

## 2020-04-26 DIAGNOSIS — M810 Age-related osteoporosis without current pathological fracture: Secondary | ICD-10-CM | POA: Diagnosis not present

## 2020-04-26 DIAGNOSIS — Z1389 Encounter for screening for other disorder: Secondary | ICD-10-CM | POA: Diagnosis not present

## 2020-04-26 DIAGNOSIS — Z853 Personal history of malignant neoplasm of breast: Secondary | ICD-10-CM | POA: Diagnosis not present

## 2020-04-26 DIAGNOSIS — I359 Nonrheumatic aortic valve disorder, unspecified: Secondary | ICD-10-CM | POA: Diagnosis not present

## 2020-04-26 DIAGNOSIS — Z1159 Encounter for screening for other viral diseases: Secondary | ICD-10-CM | POA: Diagnosis not present

## 2020-04-26 DIAGNOSIS — I10 Essential (primary) hypertension: Secondary | ICD-10-CM | POA: Diagnosis not present

## 2020-06-14 ENCOUNTER — Other Ambulatory Visit (HOSPITAL_COMMUNITY): Payer: Self-pay | Admitting: Family Medicine

## 2020-06-14 DIAGNOSIS — I359 Nonrheumatic aortic valve disorder, unspecified: Secondary | ICD-10-CM

## 2020-07-06 ENCOUNTER — Other Ambulatory Visit: Payer: Self-pay

## 2020-07-06 ENCOUNTER — Ambulatory Visit (HOSPITAL_COMMUNITY): Payer: Medicare HMO | Attending: Internal Medicine

## 2020-07-06 DIAGNOSIS — I359 Nonrheumatic aortic valve disorder, unspecified: Secondary | ICD-10-CM | POA: Insufficient documentation

## 2020-07-06 LAB — ECHOCARDIOGRAM COMPLETE
Area-P 1/2: 3.37 cm2
S' Lateral: 2.5 cm

## 2020-08-09 ENCOUNTER — Ambulatory Visit
Admission: RE | Admit: 2020-08-09 | Discharge: 2020-08-09 | Disposition: A | Discharge status: Home / Self Care | Payer: Medicare HMO | Source: Ambulatory Visit | Attending: Family Medicine | Admitting: Family Medicine

## 2020-08-09 ENCOUNTER — Other Ambulatory Visit: Payer: Self-pay

## 2020-08-09 DIAGNOSIS — M810 Age-related osteoporosis without current pathological fracture: Secondary | ICD-10-CM

## 2020-08-09 DIAGNOSIS — M85852 Other specified disorders of bone density and structure, left thigh: Secondary | ICD-10-CM | POA: Diagnosis not present

## 2020-08-09 DIAGNOSIS — Z780 Asymptomatic menopausal state: Secondary | ICD-10-CM | POA: Diagnosis not present

## 2020-09-05 DIAGNOSIS — Z9011 Acquired absence of right breast and nipple: Secondary | ICD-10-CM | POA: Diagnosis not present

## 2020-09-05 DIAGNOSIS — C50912 Malignant neoplasm of unspecified site of left female breast: Secondary | ICD-10-CM | POA: Diagnosis not present

## 2020-09-14 DIAGNOSIS — H524 Presbyopia: Secondary | ICD-10-CM | POA: Diagnosis not present

## 2020-09-28 DIAGNOSIS — C50912 Malignant neoplasm of unspecified site of left female breast: Secondary | ICD-10-CM | POA: Diagnosis not present

## 2020-09-28 DIAGNOSIS — Z9011 Acquired absence of right breast and nipple: Secondary | ICD-10-CM | POA: Diagnosis not present

## 2020-10-27 DIAGNOSIS — I10 Essential (primary) hypertension: Secondary | ICD-10-CM | POA: Diagnosis not present

## 2020-11-17 DIAGNOSIS — M4800 Spinal stenosis, site unspecified: Secondary | ICD-10-CM | POA: Diagnosis not present

## 2020-11-21 DIAGNOSIS — M5136 Other intervertebral disc degeneration, lumbar region: Secondary | ICD-10-CM | POA: Diagnosis not present

## 2020-11-21 DIAGNOSIS — M5417 Radiculopathy, lumbosacral region: Secondary | ICD-10-CM | POA: Diagnosis not present

## 2020-12-19 ENCOUNTER — Other Ambulatory Visit: Payer: Self-pay | Admitting: Family Medicine

## 2020-12-19 DIAGNOSIS — Z1231 Encounter for screening mammogram for malignant neoplasm of breast: Secondary | ICD-10-CM

## 2020-12-21 DIAGNOSIS — M5416 Radiculopathy, lumbar region: Secondary | ICD-10-CM | POA: Diagnosis not present

## 2021-02-09 ENCOUNTER — Ambulatory Visit
Admission: RE | Admit: 2021-02-09 | Discharge: 2021-02-09 | Disposition: A | Discharge status: Home / Self Care | Payer: Medicare HMO | Source: Ambulatory Visit | Attending: Family Medicine | Admitting: Family Medicine

## 2021-02-09 ENCOUNTER — Other Ambulatory Visit: Payer: Self-pay

## 2021-02-09 DIAGNOSIS — Z1231 Encounter for screening mammogram for malignant neoplasm of breast: Secondary | ICD-10-CM

## 2021-05-11 DIAGNOSIS — I10 Essential (primary) hypertension: Secondary | ICD-10-CM | POA: Diagnosis not present

## 2021-05-11 DIAGNOSIS — F419 Anxiety disorder, unspecified: Secondary | ICD-10-CM | POA: Diagnosis not present

## 2021-05-11 DIAGNOSIS — Z853 Personal history of malignant neoplasm of breast: Secondary | ICD-10-CM | POA: Diagnosis not present

## 2021-05-11 DIAGNOSIS — M810 Age-related osteoporosis without current pathological fracture: Secondary | ICD-10-CM | POA: Diagnosis not present

## 2021-05-11 DIAGNOSIS — Z0000 Encounter for general adult medical examination without abnormal findings: Secondary | ICD-10-CM | POA: Diagnosis not present

## 2021-05-11 DIAGNOSIS — J309 Allergic rhinitis, unspecified: Secondary | ICD-10-CM | POA: Diagnosis not present

## 2021-05-11 DIAGNOSIS — E559 Vitamin D deficiency, unspecified: Secondary | ICD-10-CM | POA: Diagnosis not present

## 2021-05-11 DIAGNOSIS — M4800 Spinal stenosis, site unspecified: Secondary | ICD-10-CM | POA: Diagnosis not present

## 2021-05-11 DIAGNOSIS — I359 Nonrheumatic aortic valve disorder, unspecified: Secondary | ICD-10-CM | POA: Diagnosis not present

## 2021-05-30 DIAGNOSIS — S40861A Insect bite (nonvenomous) of right upper arm, initial encounter: Secondary | ICD-10-CM | POA: Diagnosis not present

## 2021-05-30 DIAGNOSIS — R21 Rash and other nonspecific skin eruption: Secondary | ICD-10-CM | POA: Diagnosis not present

## 2021-08-21 DIAGNOSIS — F411 Generalized anxiety disorder: Secondary | ICD-10-CM | POA: Diagnosis not present

## 2021-08-25 DIAGNOSIS — M1711 Unilateral primary osteoarthritis, right knee: Secondary | ICD-10-CM | POA: Diagnosis not present

## 2021-09-11 DIAGNOSIS — H2513 Age-related nuclear cataract, bilateral: Secondary | ICD-10-CM | POA: Diagnosis not present

## 2021-09-12 DIAGNOSIS — H524 Presbyopia: Secondary | ICD-10-CM | POA: Diagnosis not present

## 2021-09-26 DIAGNOSIS — M1711 Unilateral primary osteoarthritis, right knee: Secondary | ICD-10-CM | POA: Diagnosis not present

## 2021-11-14 DIAGNOSIS — F411 Generalized anxiety disorder: Secondary | ICD-10-CM | POA: Diagnosis not present

## 2021-11-14 DIAGNOSIS — J309 Allergic rhinitis, unspecified: Secondary | ICD-10-CM | POA: Diagnosis not present

## 2021-11-14 DIAGNOSIS — I10 Essential (primary) hypertension: Secondary | ICD-10-CM | POA: Diagnosis not present

## 2021-11-25 ENCOUNTER — Ambulatory Visit (INDEPENDENT_AMBULATORY_CARE_PROVIDER_SITE_OTHER): Payer: Medicare HMO

## 2021-11-25 ENCOUNTER — Encounter (HOSPITAL_COMMUNITY): Payer: Self-pay

## 2021-11-25 ENCOUNTER — Ambulatory Visit (HOSPITAL_COMMUNITY)
Admission: EM | Admit: 2021-11-25 | Discharge: 2021-11-25 | Disposition: A | Discharge status: Home / Self Care | Payer: Medicare HMO | Attending: Physician Assistant | Admitting: Physician Assistant

## 2021-11-25 DIAGNOSIS — W19XXXA Unspecified fall, initial encounter: Secondary | ICD-10-CM

## 2021-11-25 DIAGNOSIS — S92355A Nondisplaced fracture of fifth metatarsal bone, left foot, initial encounter for closed fracture: Secondary | ICD-10-CM | POA: Diagnosis not present

## 2021-11-25 DIAGNOSIS — M79672 Pain in left foot: Secondary | ICD-10-CM | POA: Diagnosis not present

## 2021-11-25 MED ORDER — HYDROCODONE-ACETAMINOPHEN 5-325 MG PO TABS: 0.5000 | ORAL_TABLET | Freq: Two times a day (BID) | ORAL | 0 refills | Status: AC | PRN

## 2021-11-25 NOTE — ED Provider Notes (Signed)
[facility]    CSN: [id number] Arrival date & time: 11/25/21  1756      History   Chief Complaint Chief Complaint  Patient presents with   Foot Pain    HPI Kimberly Arnold is a 71 y.o. female.   Patient presents today with a several hour history of lateral left foot pain following injury.  Reports that she fell at approximately 2:30 PM today and has had ongoing pain since that time.  At rest pain is rated 6 on a 0-10 pain scale but increases to 10 with attempted ambulation, localized to lateral left foot without radiation, described as sharp, no aggravating or alleviating factors identified.  She has tried ibuprofen with temporary relief of symptoms.  She is also put ice on the area.  She denies previous injury or surgery involving her foot or ankle.  She does report some lateral numbness of the foot but is able to move her toes without difficulty.  She did not hit her head during the fall and denies any loss of consciousness, headache, dizziness, visual disturbance, nausea, vomiting, amnesia surrounding event.    Past Medical History:  Diagnosis Date   Cancer [facility])    lt breast-5 yrs ago (surgery only)-Dr. Murinson-released from care '01   Heart murmur    Hypertension    Osteopenia    Seasonal allergies    Spinal stenosis    Wears glasses     There are no problems to display for this patient.   Past Surgical History:  Procedure Laterality Date   BREAST CAPSULECTOMY WITH IMPLANT EXCHANGE Left 11/09/2013   Procedure: REPLACEMENT OF LEFT BREAST IMPLANT CAPSULECTOMY RELEASE OF SCAR TISSUE ;  Surgeon: [name], MD;  Location: [place];  Service: Plastics;  Laterality: Left;   BREAST RECONSTRUCTION  1989   left implant after mastectomy   COLONOSCOPY     COLONOSCOPY WITH PROPOFOL N/A 05/30/2015   Procedure: COLONOSCOPY WITH PROPOFOL;  Surgeon: Garlan Fair, MD;  Location: [facility];  Service: Endoscopy;  Laterality: N/A;   DILATION AND  CURETTAGE OF UTERUS     MASTECTOMY Left    REDUCTION MAMMAPLASTY Right    SHOULDER ARTHROSCOPY W/ ROTATOR CUFF REPAIR  2008   left   SIMPLE MASTECTOMY  1989   left "modified radical masctectomy"-    TUBAL LIGATION      OB History   No obstetric history on file.      Home Medications    Prior to Admission medications   Medication Sig Start Date End Date Taking? Authorizing Provider  HYDROcodone-acetaminophen (NORCO/VICODIN) 5-325 MG tablet Take 0.5-1 tablets by mouth 2 (two) times daily as needed for up to 2 days. 11/25/21 11/27/21 Yes Jochebed Bills K, PA-C  amLODipine (NORVASC) 10 MG tablet Take 10 mg by mouth daily.    [provider]  aspirin EC 81 MG tablet Take 81 mg by mouth daily.    [provider]  cholecalciferol (VITAMIN D) 1000 UNITS tablet Take 1,000 Units by mouth daily.    [provider]  fluticasone (FLONASE) 50 MCG/ACT nasal spray Place 2 sprays into both nostrils daily as needed for allergies or rhinitis.     [provider]  loratadine (CLARITIN) 10 MG tablet Take 10 mg by mouth daily as needed for allergies.     [provider]  losartan (COZAAR) 100 MG tablet Take 100 mg by mouth daily.    [provider]  mometasone (ELOCON) 0.1 %  cream Apply 1 application topically daily. Apply to affected areas daily 07/12/18   Wardell Honour, MD    Family History Family History  Problem Relation Age of Onset   Breast cancer Neg Hx     Social History Social History   Tobacco Use   Smoking status: Never  Substance Use Topics   Alcohol use: Yes    Comment: occ. rare   Drug use: No     Allergies   Ace inhibitors, Beta adrenergic blockers, Tetracyclines & related, and Tramadol   Review of Systems Review of Systems  Constitutional:  Positive for activity change. Negative for appetite change, fatigue and fever.  Eyes:  Negative for visual disturbance.  Gastrointestinal:  Negative for nausea and vomiting.   Musculoskeletal:  Positive for arthralgias, gait problem and joint swelling. Negative for myalgias.  Skin:  Negative for color change and wound.  Neurological:  Negative for dizziness, weakness, light-headedness, numbness and headaches.     Physical Exam Triage Vital Signs ED Triage Vitals [11/25/21 1833]  Enc Vitals Group     BP 139/89     Pulse Rate 84     Resp 16     Temp 98.1 F (36.7 C)     Temp Source Oral     SpO2 94 %     Weight      Height      Head Circumference      Peak Flow      Pain Score 4     Pain Loc      Pain Edu?      Excl. in [place]?    No data found.  Updated Vital Signs BP 139/89 (BP Location: Left Arm)   Pulse 84   Temp 98.1 F (36.7 C) (Oral)   Resp 16   SpO2 94%   Visual Acuity Right Eye Distance:   Left Eye Distance:   Bilateral Distance:    Right Eye Near:   Left Eye Near:    Bilateral Near:     Physical Exam Vitals reviewed.  Constitutional:      General: She is awake. She is not in acute distress.    Appearance: Normal appearance. She is well-developed. She is not ill-appearing.     Comments: Very pleasant female appears stated age in no acute distress sitting comfortably in exam room  HENT:     Head: Normocephalic and atraumatic.  Cardiovascular:     Rate and Rhythm: Normal rate and regular rhythm.     Heart sounds: Normal heart sounds, S1 normal and S2 normal. No murmur heard. Pulmonary:     Effort: Pulmonary effort is normal.     Breath sounds: Normal breath sounds. No wheezing, rhonchi or rales.     Comments: Very pleasant female appears stated age in no acute distress sitting comfortably in exam room Musculoskeletal:     Left ankle: Swelling present. No lacerations. No tenderness. Normal range of motion. Anterior drawer test negative.     Left foot: Normal range of motion. Swelling, tenderness and bony tenderness present. Normal pulse.     Comments: Left ankle/foot: Swelling noted lateral left foot and ankle.  No  tenderness palpation over lateral malleolus.  Tenderness and deformity noted over proximal fifth metatarsal.  Normal active range of motion of ankle and phalanges.  Decreased sensation of lateral left foot otherwise neurovascularly intact.  Psychiatric:        Behavior: Behavior is cooperative.      UC Treatments / Results  Labs (all labs ordered are listed, but only abnormal results are displayed) Labs Reviewed - No data to display  EKG   Radiology DG Foot Complete Left  Result Date: 11/25/2021 CLINICAL DATA:  Trauma, fall, pain EXAM: LEFT FOOT - COMPLETE 3+ VIEW COMPARISON:  None Available. FINDINGS: There is faint radiolucent line in the base of left fifth metatarsal. Rest of the bony structures appear intact. Hallux valgus deformity is noted. Degenerative changes are noted in first metatarsophalangeal joint. Plantar spur is seen in the calcaneus. IMPRESSION: Recent undisplaced fracture is seen in the base of left fifth metatarsal. Other findings as described in the body of the report. Electronically Signed   By: Elmer Picker M.D.   On: 11/25/2021 19:18    Procedures Procedures (including critical care time)  Medications Ordered in UC Medications - No data to display  Initial Impression / Assessment and Plan / UC Course  I have reviewed the triage vital signs and the nursing notes.  Pertinent labs & imaging results that were available during my care of the patient were reviewed by me and considered in my medical decision making (see chart for details).     X-ray obtained given mechanism of injury and tenderness over metatarsal which showed nondisplaced metatarsal fracture.  Patient was placed in cam boot with instruction to limit weightbearing activities until seen by podiatry.  She was given contact information for podiatry with instruction to call to schedule an appointment first thing Monday morning.  We do not have previous kidney function in electronic medical record  and patient reports she has been told to limit NSAIDs due to hypertension and chronic kidney disease so we will defer NSAIDs.  She is safely taken hydrocodone/oxycodone in the past and so we will call in a low-dose of hydrocodone (0.5 to 1 tablet of 5/325 tablet twice daily as needed).  Discussed that this is sedating and she should not drive or drink alcohol with taking it.  Recommended RICE protocol.  Discussed that if she has any worsening symptoms including increased pain, swelling, numbness/tingling, cold sensation in her foot, swelling/tightness she needs to be seen immediately to which she expressed understanding.  Strict return precautions given to which she expressed understanding.  Final Clinical Impressions(s) / UC Diagnoses   Final diagnoses:  Left foot pain  Nondisplaced fracture of fifth metatarsal bone, left foot, initial encounter for closed fracture     Discharge Instructions      You have a fracture in your foot.  Please use cam boot and try to limit bearing weight until you are seen by podiatry.  Call them first thing on Monday to schedule an appointment.  You can use hydrocodone 1/2 tablet to 1 tablet twice a day as needed.  This will make you sleepy so do not drive or drink alcohol while taking it.  Keep your foot elevated and also use ice.  If you have any severe pain, numbness/tingling, swelling of the foot, weakness you need to go to the emergency room.     ED Prescriptions     Medication Sig Dispense Auth. Provider   HYDROcodone-acetaminophen (NORCO/VICODIN) 5-325 MG tablet Take 0.5-1 tablets by mouth 2 (two) times daily as needed for up to 2 days. 4 tablet Shalena Ezzell K, PA-C      I have reviewed the PDMP during this encounter.   Terrilee Croak, PA-C 11/25/21 1930

## 2021-11-25 NOTE — ED Triage Notes (Signed)
Pt states she fell today c/o pain/tingling to left ankle. States she put ice on it with some relief.

## 2021-11-25 NOTE — Discharge Instructions (Signed)
You have a fracture in your foot.  Please use cam boot and try to limit bearing weight until you are seen by podiatry.  Call them first thing on Monday to schedule an appointment.  You can use hydrocodone 1/2 tablet to 1 tablet twice a day as needed.  This will make you sleepy so do not drive or drink alcohol while taking it.  Keep your foot elevated and also use ice.  If you have any severe pain, numbness/tingling, swelling of the foot, weakness you need to go to the emergency room.

## 2021-11-29 ENCOUNTER — Ambulatory Visit: Payer: Medicare HMO | Admitting: Podiatry

## 2021-11-29 ENCOUNTER — Encounter: Payer: Self-pay | Admitting: Podiatry

## 2021-11-29 DIAGNOSIS — S92352A Displaced fracture of fifth metatarsal bone, left foot, initial encounter for closed fracture: Secondary | ICD-10-CM

## 2021-11-29 NOTE — Progress Notes (Signed)
  Subjective:  Patient ID: Kimberly Arnold, female    DOB: [birth year],   MRN: [id number]  Chief Complaint  Patient presents with   Fracture    Left foot fracture    71 y.o. female presents for concern of left foot fracture. Relates on Saturday she fell and went to urgent care. She had X-rays done and told she had a fracture. Was given a boot and told to follow-up with [place]. She has been sleeping in boot. Doing well with no swelling and minimal pain . Denies any other pedal complaints. Denies n/v/f/c.   Past Medical History:  Diagnosis Date   Cancer [facility])    lt breast-5 yrs ago (surgery only)-Dr. Murinson-released from care '01   Heart murmur    Hypertension    Osteopenia    Seasonal allergies    Spinal stenosis    Wears glasses     Objective:  Physical Exam: Vascular: DP/PT pulses 2/4 bilateral. CFT <3 seconds. Normal hair growth on digits. No edema.  Skin. No lacerations or abrasions bilateral feet.  Musculoskeletal: MMT 5/5 bilateral lower extremities in DF, PF, Inversion and Eversion. Deceased ROM in DF of ankle joint. Tender to the base of the proximal phalanx. Exam limited due to pain.  Neurological: Sensation intact to light touch.   Assessment:   1. Closed fracture of base of fifth metatarsal bone of left foot, initial encounter      Plan:  Patient was evaluated and treated and all questions answered. -Xrays reviewed from urgent care showing avulsion fracture at base of fifth metatarsal non displaced.  -Discussed treatement options for fifth metatarsal fracture; risks, alternatives, and benefits explained. -Continue with CAM boot -Recommend protection, rest, ice, elevation daily until symptoms improve -Rx pain med/antinflammatories as needed -Patient to return to office in 4 weeks for serial x-rays to assess healing  or sooner if condition worsens.   Lorenda Peck, DPM

## 2021-12-01 DIAGNOSIS — M5416 Radiculopathy, lumbar region: Secondary | ICD-10-CM | POA: Diagnosis not present

## 2022-01-02 ENCOUNTER — Other Ambulatory Visit: Payer: Self-pay | Admitting: Family Medicine

## 2022-01-02 ENCOUNTER — Ambulatory Visit: Payer: Medicare HMO | Admitting: Podiatry

## 2022-01-02 ENCOUNTER — Encounter: Payer: Self-pay | Admitting: Podiatry

## 2022-01-02 ENCOUNTER — Ambulatory Visit (INDEPENDENT_AMBULATORY_CARE_PROVIDER_SITE_OTHER): Payer: Medicare HMO

## 2022-01-02 DIAGNOSIS — Z1231 Encounter for screening mammogram for malignant neoplasm of breast: Secondary | ICD-10-CM

## 2022-01-02 DIAGNOSIS — S92352A Displaced fracture of fifth metatarsal bone, left foot, initial encounter for closed fracture: Secondary | ICD-10-CM

## 2022-01-02 NOTE — Progress Notes (Signed)
  Subjective:  Patient ID: Kimberly Arnold, female    DOB: 10-13-1950,   MRN: [id number]  Chief Complaint  Patient presents with   Foot Pain    Patient states that she is feeling better overall. And that she is ready to retire the boot.    71 y.o. female presents for concern of left foot fracture. Relates it is doing well and not having too much pain and has been wearing the boot.  She has been sleeping in boot. Doing well with no swelling and minimal pain . Denies any other pedal complaints. Denies n/v/f/c.   Past Medical History:  Diagnosis Date   Cancer [facility])    lt breast-5 yrs ago (surgery only)-Dr. Murinson-released from care '01   Heart murmur    Hypertension    Osteopenia    Seasonal allergies    Spinal stenosis    Wears glasses     Objective:  Physical Exam: Vascular: DP/PT pulses 2/4 bilateral. CFT <3 seconds. Normal hair growth on digits. No edema.  Skin. No lacerations or abrasions bilateral feet.  Musculoskeletal: MMT 5/5 bilateral lower extremities in DF, PF, Inversion and Eversion. Deceased ROM in DF of ankle joint. Mildy tender to the base of the proximal phalanx. Exam limited due to pain.  Neurological: Sensation intact to light touch.   Assessment:   1. Closed fracture of base of fifth metatarsal bone of left foot, initial encounter      Plan:  Patient was evaluated and treated and all questions answered. -Xrays reviewed from urgent care showing avulsion fracture at base of fifth metatarsal non displaced.  -Discussed treatement options for fifth metatarsal fracture; risks, alternatives, and benefits explained. -Continue with CAM boot for one more week than transition to regular stiff soled shoe.  -Recommend protection, rest, ice, elevation daily until symptoms improve -Rx pain med/antinflammatories as needed -Patient to return to office in 4 weeks for serial x-rays to assess healing  or sooner if condition worsens.   Lorenda Peck, DPM

## 2022-01-18 DIAGNOSIS — M1711 Unilateral primary osteoarthritis, right knee: Secondary | ICD-10-CM | POA: Diagnosis not present

## 2022-01-30 ENCOUNTER — Ambulatory Visit (INDEPENDENT_AMBULATORY_CARE_PROVIDER_SITE_OTHER): Payer: Medicare HMO

## 2022-01-30 ENCOUNTER — Encounter: Payer: Self-pay | Admitting: Podiatry

## 2022-01-30 ENCOUNTER — Ambulatory Visit: Payer: Medicare HMO | Admitting: Podiatry

## 2022-01-30 DIAGNOSIS — M810 Age-related osteoporosis without current pathological fracture: Secondary | ICD-10-CM | POA: Insufficient documentation

## 2022-01-30 DIAGNOSIS — Z853 Personal history of malignant neoplasm of breast: Secondary | ICD-10-CM | POA: Insufficient documentation

## 2022-01-30 DIAGNOSIS — I359 Nonrheumatic aortic valve disorder, unspecified: Secondary | ICD-10-CM | POA: Insufficient documentation

## 2022-01-30 DIAGNOSIS — F419 Anxiety disorder, unspecified: Secondary | ICD-10-CM | POA: Insufficient documentation

## 2022-01-30 DIAGNOSIS — J309 Allergic rhinitis, unspecified: Secondary | ICD-10-CM | POA: Insufficient documentation

## 2022-01-30 DIAGNOSIS — S92352D Displaced fracture of fifth metatarsal bone, left foot, subsequent encounter for fracture with routine healing: Secondary | ICD-10-CM

## 2022-01-30 DIAGNOSIS — M48061 Spinal stenosis, lumbar region without neurogenic claudication: Secondary | ICD-10-CM | POA: Insufficient documentation

## 2022-01-30 DIAGNOSIS — I10 Essential (primary) hypertension: Secondary | ICD-10-CM | POA: Insufficient documentation

## 2022-01-30 DIAGNOSIS — E559 Vitamin D deficiency, unspecified: Secondary | ICD-10-CM | POA: Insufficient documentation

## 2022-01-30 NOTE — Progress Notes (Signed)
  Subjective:  Patient ID: Kimberly Arnold, female    DOB: 1950-07-01,   MRN: [id number]  Chief Complaint  Patient presents with   Fracture    Follow up fracture 5th met base left   "Its been feeling pretty good"    71 y.o. female presents for concern of left foot fracture. Relates it is doing well and not having too much pain and has been back in regular shoes and doing well.   Doing well with no swelling and minimal pain . Denies any other pedal complaints. Denies n/v/f/c.   Past Medical History:  Diagnosis Date   Cancer [facility])    lt breast-5 yrs ago (surgery only)-Dr. Murinson-released from care '01   Heart murmur    Hypertension    Osteopenia    Seasonal allergies    Spinal stenosis    Wears glasses     Objective:  Physical Exam: Vascular: DP/PT pulses 2/4 bilateral. CFT <3 seconds. Normal hair growth on digits. No edema.  Skin. No lacerations or abrasions bilateral feet.  Musculoskeletal: MMT 5/5 bilateral lower extremities in DF, PF, Inversion and Eversion. Deceased ROM in DF of ankle joint.No pain to palpation in fifth metatarsal area.  Neurological: Sensation intact to light touch.   Assessment:   1. Fracture of base of fifth metatarsal bone of left foot with routine healing, subsequent encounter      Plan:  Patient was evaluated and treated and all questions answered. -Xrays reviewed and healing avulsion fracture of fifth metatarsal base nearly completely healed.  -Discussed treatement options for fifth metatarsal fracture; risks, alternatives, and benefits explained. -Continue in regular shoes.  -Recommend protection, rest, ice, elevation daily until symptoms improve -Rx pain med/antinflammatories as needed -Patient to return to office as needed.    Lorenda Peck, DPM

## 2022-02-13 ENCOUNTER — Ambulatory Visit
Admission: RE | Admit: 2022-02-13 | Discharge: 2022-02-13 | Disposition: A | Discharge status: Home / Self Care | Payer: Medicare HMO | Source: Ambulatory Visit | Attending: Family Medicine | Admitting: Family Medicine

## 2022-02-13 DIAGNOSIS — Z1231 Encounter for screening mammogram for malignant neoplasm of breast: Secondary | ICD-10-CM

## 2022-03-06 DIAGNOSIS — M25512 Pain in left shoulder: Secondary | ICD-10-CM | POA: Diagnosis not present

## 2022-04-28 ENCOUNTER — Emergency Department (HOSPITAL_COMMUNITY)
Admission: EM | Admit: 2022-04-28 | Discharge: 2022-04-29 | Disposition: A | Discharge status: Home / Self Care | Payer: Medicare HMO | Attending: Emergency Medicine | Admitting: Emergency Medicine

## 2022-04-28 ENCOUNTER — Other Ambulatory Visit: Payer: Self-pay

## 2022-04-28 ENCOUNTER — Emergency Department (HOSPITAL_COMMUNITY): Payer: Medicare HMO

## 2022-04-28 DIAGNOSIS — U071 COVID-19: Secondary | ICD-10-CM | POA: Diagnosis not present

## 2022-04-28 DIAGNOSIS — M791 Myalgia, unspecified site: Secondary | ICD-10-CM | POA: Diagnosis present

## 2022-04-28 DIAGNOSIS — Z79899 Other long term (current) drug therapy: Secondary | ICD-10-CM | POA: Insufficient documentation

## 2022-04-28 DIAGNOSIS — R059 Cough, unspecified: Secondary | ICD-10-CM | POA: Diagnosis not present

## 2022-04-28 DIAGNOSIS — R509 Fever, unspecified: Secondary | ICD-10-CM | POA: Diagnosis not present

## 2022-04-28 DIAGNOSIS — I10 Essential (primary) hypertension: Secondary | ICD-10-CM | POA: Diagnosis not present

## 2022-04-28 LAB — CBC WITH DIFFERENTIAL/PLATELET
Abs Immature Granulocytes: 0.01 10*3/uL (ref 0.00–0.07)
Basophils Absolute: 0 10*3/uL (ref 0.0–0.1)
Basophils Relative: 0 %
Eosinophils Absolute: 0 10*3/uL (ref 0.0–0.5)
Eosinophils Relative: 0 %
HCT: 43 % (ref 36.0–46.0)
Hemoglobin: 13.9 g/dL (ref 12.0–15.0)
Immature Granulocytes: 0 %
Lymphocytes Relative: 18 %
Lymphs Abs: 1.7 10*3/uL (ref 0.7–4.0)
MCH: 26.9 pg (ref 26.0–34.0)
MCHC: 32.3 g/dL (ref 30.0–36.0)
MCV: 83.2 fL (ref 80.0–100.0)
Monocytes Absolute: 1 10*3/uL (ref 0.1–1.0)
Monocytes Relative: 11 %
Neutro Abs: 6.3 10*3/uL (ref 1.7–7.7)
Neutrophils Relative %: 71 %
Platelets: 191 10*3/uL (ref 150–400)
RBC: 5.17 MIL/uL — ABNORMAL HIGH (ref 3.87–5.11)
RDW: 15.8 % — ABNORMAL HIGH (ref 11.5–15.5)
WBC: 9 10*3/uL (ref 4.0–10.5)
nRBC: 0 % (ref 0.0–0.2)

## 2022-04-28 LAB — COMPREHENSIVE METABOLIC PANEL
ALT: 27 U/L (ref 0–44)
AST: 30 U/L (ref 15–41)
Albumin: 3.9 g/dL (ref 3.5–5.0)
Alkaline Phosphatase: 57 U/L (ref 38–126)
Anion gap: 13 (ref 5–15)
BUN: 8 mg/dL (ref 8–23)
CO2: 23 mmol/L (ref 22–32)
Calcium: 10 mg/dL (ref 8.9–10.3)
Chloride: 103 mmol/L (ref 98–111)
Creatinine, Ser: 1.11 mg/dL — ABNORMAL HIGH (ref 0.44–1.00)
GFR, Estimated: 52 mL/min — ABNORMAL LOW (ref >60)
Glucose, Bld: 158 mg/dL — ABNORMAL HIGH (ref 70–99)
Potassium: 3 mmol/L — ABNORMAL LOW (ref 3.5–5.1)
Sodium: 139 mmol/L (ref 135–145)
Total Bilirubin: 0.7 mg/dL (ref 0.3–1.2)
Total Protein: 7.5 g/dL (ref 6.5–8.1)

## 2022-04-28 LAB — RESP PANEL BY RT-PCR (FLU A&B, COVID) ARPGX2
Influenza A by PCR: NEGATIVE
Influenza B by PCR: NEGATIVE
SARS Coronavirus 2 by RT PCR: POSITIVE — AB

## 2022-04-28 LAB — CBG MONITORING, ED: Glucose-Capillary: 154 mg/dL — ABNORMAL HIGH (ref 70–99)

## 2022-04-28 MED ORDER — ONDANSETRON 4 MG PO TBDP
4.0000 mg | ORAL_TABLET | Freq: Once | ORAL | Status: AC
  Administered 2022-04-28: 4 mg via ORAL
  Filled 2022-04-28: qty 1

## 2022-04-28 MED ORDER — ACETAMINOPHEN 325 MG PO TABS
650.0000 mg | ORAL_TABLET | Freq: Once | ORAL | Status: AC
  Administered 2022-04-28: 650 mg via ORAL
  Filled 2022-04-28: qty 2

## 2022-04-28 NOTE — ED Triage Notes (Signed)
Pt states she started feeling bad yesterday. Today, she has had fever, cough, all over body aches. Last took nyquil nighttime around 1900, last tylenol around 1500

## 2022-04-28 NOTE — ED Provider Triage Note (Signed)
Emergency Medicine Provider Triage Evaluation Note  Kimberly Arnold , a 71 y.o. female  was evaluated in triage.  Pt complains of fever, cough and body aches. Symptoms began yesterday with scratchy throat. Coughing up white phlegm.. Had temp of 101 at home. Was taking nyquil at home without relief. Today had hot and cold chills. Decreased appetite and temp up to 102.5. denies chest pain or shortness of breath.   Review of Systems  Positive: See above Negative:   Physical Exam  BP 111/80   Pulse 75   Temp (!) 101.3 F (38.5 C) (Oral)   Resp 20   SpO2 94%  Gen:   Awake, ill appearing  Resp:  Normal effort, lungs are clear MSK:   Moves extremities without difficulty  Other:    Medical Decision Making  Medically screening exam initiated at 8:18 PM.  Appropriate orders placed.  ARYAN BELLO was informed that the remainder of the evaluation will be completed by another provider, this initial triage assessment does not replace that evaluation, and the importance of remaining in the ED until their evaluation is complete.     Mickie Hillier, PA-C 04/28/22 2021

## 2022-04-28 NOTE — ED Notes (Signed)
Patient placed in appropriate seating area

## 2022-04-29 MED ORDER — METOCLOPRAMIDE HCL 5 MG/ML IJ SOLN
10.0000 mg | Freq: Once | INTRAMUSCULAR | Status: AC
  Administered 2022-04-29: 10 mg via INTRAVENOUS
  Filled 2022-04-29: qty 2

## 2022-04-29 MED ORDER — ONDANSETRON HCL 4 MG PO TABS: 4.0000 mg | ORAL_TABLET | Freq: Four times a day (QID) | ORAL | 0 refills | Status: AC

## 2022-04-29 MED ORDER — ONDANSETRON 4 MG PO TBDP
4.0000 mg | ORAL_TABLET | Freq: Once | ORAL | Status: DC
  Filled 2022-04-29: qty 1

## 2022-04-29 MED ORDER — NIRMATRELVIR/RITONAVIR (PAXLOVID)TABLET: 3.0000 | ORAL_TABLET | Freq: Two times a day (BID) | ORAL | 0 refills | Status: AC

## 2022-04-29 MED ORDER — LACTATED RINGERS IV BOLUS
1000.0000 mL | Freq: Once | INTRAVENOUS | Status: AC
  Administered 2022-04-29: 1000 mL via INTRAVENOUS

## 2022-04-29 NOTE — ED Notes (Addendum)
Pt says she feels like she has "too much medicine" on her stomach. C/o pain all over. Given crackers and water.

## 2022-04-29 NOTE — Discharge Instructions (Signed)
Make sure you are staying hydrated even if you do not feel like eating a lot.

## 2022-04-29 NOTE — ED Provider Notes (Signed)
[facility] EMERGENCY DEPARTMENT Provider Note   CSN: [id number] Arrival date & time: 04/28/22  1934     History  Chief Complaint  Patient presents with   Generalized Body Aches    Kimberly Arnold is a 71 y.o. female.  Patient is a 71 year old female with a history of hypertension, heart murmur, spinal stenosis and prior breast cancer who is presenting today with a 2-day history of general malaise, fever, cough and myalgias.  Symptoms started Friday and she came yesterday but has unfortunately waited for 15 hours in the waiting room.  Patient denies any shortness of breath but reports now she is also having nausea, stomach upset but no vomiting.  She denies any diarrhea.  The history is provided by the patient.       Home Medications Prior to Admission medications   Medication Sig Start Date End Date Taking? Authorizing Provider  nirmatrelvir/ritonavir EUA (PAXLOVID) 20 x 150 MG & 10 x '100MG'$  TABS Take 3 tablets by mouth 2 (two) times daily for 5 days. Patient GFR is 52. Take nirmatrelvir (150 mg) two tablets twice daily for 5 days and ritonavir (100 mg) one tablet twice daily for 5 days. 04/29/22 05/04/22 Yes Evren Shankland, Loree Fee, MD  ondansetron (ZOFRAN) 4 MG tablet Take 1 tablet (4 mg total) by mouth every 6 (six) hours. 04/29/22  Yes Joyclyn Plazola, Loree Fee, MD  amLODipine (NORVASC) 10 MG tablet Take 10 mg by mouth daily.    [provider]  aspirin EC 81 MG tablet Take 81 mg by mouth daily.    [provider]  cholecalciferol (VITAMIN D) 1000 UNITS tablet Take 1,000 Units by mouth daily.    [provider]  cloNIDine (CATAPRES) 0.1 MG tablet TAKE 1 TABLET TWICE DAILY (NEW DIRECTIONS)    [provider]  escitalopram (LEXAPRO) 5 MG tablet Take 5 mg by mouth daily. 08/06/21   [provider]  fluticasone (FLONASE) 50 MCG/ACT nasal spray Place 2 sprays into both nostrils daily as needed for allergies or rhinitis.     [provider]  ibuprofen (ADVIL) 200 MG tablet 1 tablet    [provider]  lidocaine (RE-LIEVED MAXIMUM STRENGTH) 4 % See admin instructions.    [provider]  loratadine (CLARITIN) 10 MG tablet Take 10 mg by mouth daily as needed for allergies.     [provider]  losartan (COZAAR) 100 MG tablet 1 tablet    [provider]  Multiple Vitamin (MULTIVITAMIN ADULT) TABS 1 tablet    [provider]      Allergies    Ace inhibitors, Angiotensin receptor blockers, Beta adrenergic blockers, Cephalexin, Hydrocodone, Hydrocodone-acetaminophen, Tetracyclines & related, and Tramadol    Review of Systems   Review of Systems  Physical Exam Updated Vital Signs BP (!) 152/97   Pulse 91   Temp 98.9 F (37.2 C) (Oral)   Resp 20   SpO2 99%  Physical Exam Vitals and nursing note reviewed.  Constitutional:      General: She is not in acute distress.    Appearance: She is well-developed.  HENT:     Head: Normocephalic and atraumatic.  Eyes:     Pupils: Pupils are equal, round, and reactive to light.  Cardiovascular:     Rate and Rhythm: Regular rhythm. Tachycardia present.     Heart sounds: Murmur heard.     No friction rub.  Pulmonary:     Effort: Pulmonary effort is normal.  Breath sounds: Normal breath sounds. No wheezing or rales.  Abdominal:     General: Bowel sounds are normal. There is no distension.     Palpations: Abdomen is soft.     Tenderness: There is no abdominal tenderness. There is no guarding or rebound.  Musculoskeletal:        General: No tenderness. Normal range of motion.     Right lower leg: No edema.     Left lower leg: No edema.     Comments: No edema  Skin:    General: Skin is warm and dry.     Findings: No rash.  Neurological:     Mental Status: She is alert and oriented to person, place, and time. Mental status is at baseline.     Cranial Nerves: No cranial nerve deficit.  Psychiatric:        Mood and  Affect: Mood normal.        Behavior: Behavior normal.     ED Results / Procedures / Treatments   Labs (all labs ordered are listed, but only abnormal results are displayed) Labs Reviewed  RESP PANEL BY RT-PCR (FLU A&B, COVID) ARPGX2 - Abnormal; Notable for the following components:      Result Value   SARS Coronavirus 2 by RT PCR POSITIVE (*)    All other components within normal limits  COMPREHENSIVE METABOLIC PANEL - Abnormal; Notable for the following components:   Potassium 3.0 (*)    Glucose, Bld 158 (*)    Creatinine, Ser 1.11 (*)    GFR, Estimated 52 (*)    All other components within normal limits  CBC WITH DIFFERENTIAL/PLATELET - Abnormal; Notable for the following components:   RBC 5.17 (*)    RDW 15.8 (*)    All other components within normal limits  CBG MONITORING, ED - Abnormal; Notable for the following components:   Glucose-Capillary 154 (*)    All other components within normal limits    EKG EKG Interpretation  Date/Time:  Saturday April 28 2022 20:36:03 EST Ventricular Rate:  82 PR Interval:  194 QRS Duration: 84 QT Interval:  466 QTC Calculation: 544 R Axis:   -19 Text Interpretation: Normal sinus rhythm Minimal voltage criteria for LVH, may be normal variant ( R in aVL ) Septal infarct , age undetermined When compared with ECG of 21-Mar-2007 08:52, PREVIOUS ECG IS PRESENT Confirmed by Blanchie Dessert [phone]) on 04/29/2022 8:45:18 AM  Radiology DG Chest 2 View  Result Date: 04/28/2022 CLINICAL DATA:  Fever, cough, and body aches. EXAM: CHEST - 2 VIEW COMPARISON:  03/21/2007 FINDINGS: Mild cardiomegaly and tortuosity of thoracic aorta are again noted. Both lungs are clear. No evidence of pleural effusion. Surgical clips again seen in left axilla. IMPRESSION: Mild cardiomegaly. No active lung disease. Electronically Signed   By: Marlaine Hind M.D.   On: 04/28/2022 21:30    Procedures Procedures    Medications Ordered in ED Medications   ondansetron (ZOFRAN-ODT) disintegrating tablet 4 mg (4 mg Oral Patient Refused/Not Given 04/29/22 0240)  ondansetron (ZOFRAN-ODT) disintegrating tablet 4 mg (4 mg Oral Given 04/28/22 2039)  acetaminophen (TYLENOL) tablet 650 mg (650 mg Oral Given 04/28/22 2039)  lactated ringers bolus 1,000 mL (1,000 mLs Intravenous New Bag/Given 04/29/22 0933)  metoCLOPramide (REGLAN) injection 10 mg (10 mg Intravenous Given 04/29/22 0865)    ED Course/ Medical Decision Making/ A&P  Medical Decision Making Amount and/or Complexity of Data Reviewed Labs: ordered. Decision-making details documented in ED Course. Radiology: ordered and independent interpretation performed. Decision-making details documented in ED Course. ECG/medicine tests: ordered and independent interpretation performed. Decision-making details documented in ED Course.  Risk Prescription drug management.   Pt with multiple medical problems and comorbidities and presenting today with a complaint that caries a high risk for morbidity and mortality.  Here today with URI symptoms with fever, cough and general malaise.  She is also having nausea but no vomiting.  Her daughter reports that earlier when she had a fever she was saying things that did not make a lot of sense but that has resolved.  I independently interpreted patient's labs and EKG.  Patient's EKG without acute findings, CBC within normal limits, CMPWith hypokalemia of 3.0 and mild bump in creatinine to 1.11 from her baseline of 0.8, COVID-positive.  I have independently visualized and interpreted pt's images today. Chest x-ray within normal limits today without signs of pneumonia.  Findings discussed with the patient and her daughter.  She is still feeling very nauseated so we will place an IV give her nausea medication and fluids as she does appear mildly dehydrated.  Symptoms have only been present for 2 days so she would be a candidate for Paxlovid.  Side  effects of the medication were discussed with she and her daughter.  She was given a prescription.  At this time patient does not meet admission criteria.  Feel that she is stable for discharge home.         Final Clinical Impression(s) / ED Diagnoses Final diagnoses:  COVID    Rx / DC Orders ED Discharge Orders          Ordered    nirmatrelvir/ritonavir EUA (PAXLOVID) 20 x 150 MG & 10 x '100MG'$  TABS  2 times daily        04/29/22 1130    ondansetron (ZOFRAN) 4 MG tablet  Every 6 hours        04/29/22 1204              Blanchie Dessert, MD 04/29/22 1205

## 2022-05-08 ENCOUNTER — Telehealth: Payer: Self-pay

## 2022-05-08 NOTE — Telephone Encounter (Signed)
        Patient  visited [facility] on 11/19   Telephone encounter attempt :  1st  A HIPAA compliant voice message was left requesting a return call.  Instructed patient to call back     [place], [place] Management  [phone] [address], [place], [place] [address] Phone: [phone] Email: [email or web address]

## 2022-05-09 ENCOUNTER — Telehealth: Payer: Self-pay

## 2022-05-09 NOTE — Telephone Encounter (Signed)
     Patient  visit on 11/19  at [facility]  Have you been able to follow up with your primary care physician? Yes   The patient was or was not able to obtain any needed medicine or equipment. Yes   Are there diet recommendations that you are having difficulty following? Na   Patient expresses understanding of discharge instructions and education provided has no other needs at this time.  Yes      [place], [facility], Care Management  [phone] [address], [place], [place] [address] Phone: [phone] Email: [email or web address]

## 2022-05-17 DIAGNOSIS — R7301 Impaired fasting glucose: Secondary | ICD-10-CM | POA: Diagnosis not present

## 2022-05-17 DIAGNOSIS — F419 Anxiety disorder, unspecified: Secondary | ICD-10-CM | POA: Diagnosis not present

## 2022-05-17 DIAGNOSIS — E669 Obesity, unspecified: Secondary | ICD-10-CM | POA: Diagnosis not present

## 2022-05-17 DIAGNOSIS — I10 Essential (primary) hypertension: Secondary | ICD-10-CM | POA: Diagnosis not present

## 2022-05-17 DIAGNOSIS — Z853 Personal history of malignant neoplasm of breast: Secondary | ICD-10-CM | POA: Diagnosis not present

## 2022-05-17 DIAGNOSIS — E559 Vitamin D deficiency, unspecified: Secondary | ICD-10-CM | POA: Diagnosis not present

## 2022-05-17 DIAGNOSIS — I359 Nonrheumatic aortic valve disorder, unspecified: Secondary | ICD-10-CM | POA: Diagnosis not present

## 2022-05-17 DIAGNOSIS — E7800 Pure hypercholesterolemia, unspecified: Secondary | ICD-10-CM | POA: Diagnosis not present

## 2022-05-17 DIAGNOSIS — Z0000 Encounter for general adult medical examination without abnormal findings: Secondary | ICD-10-CM | POA: Diagnosis not present

## 2022-05-17 DIAGNOSIS — J309 Allergic rhinitis, unspecified: Secondary | ICD-10-CM | POA: Diagnosis not present

## 2022-05-24 DIAGNOSIS — M25512 Pain in left shoulder: Secondary | ICD-10-CM | POA: Diagnosis not present

## 2022-08-29 DIAGNOSIS — H524 Presbyopia: Secondary | ICD-10-CM | POA: Diagnosis not present

## 2022-08-29 DIAGNOSIS — H2513 Age-related nuclear cataract, bilateral: Secondary | ICD-10-CM | POA: Diagnosis not present

## 2022-10-23 DIAGNOSIS — M5417 Radiculopathy, lumbosacral region: Secondary | ICD-10-CM | POA: Diagnosis not present

## 2022-11-15 DIAGNOSIS — M5136 Other intervertebral disc degeneration, lumbar region: Secondary | ICD-10-CM | POA: Diagnosis not present

## 2022-11-15 DIAGNOSIS — I10 Essential (primary) hypertension: Secondary | ICD-10-CM | POA: Diagnosis not present

## 2022-11-15 DIAGNOSIS — R600 Localized edema: Secondary | ICD-10-CM | POA: Diagnosis not present

## 2022-11-15 DIAGNOSIS — J309 Allergic rhinitis, unspecified: Secondary | ICD-10-CM | POA: Diagnosis not present

## 2022-11-15 DIAGNOSIS — E7800 Pure hypercholesterolemia, unspecified: Secondary | ICD-10-CM | POA: Diagnosis not present

## 2022-11-15 DIAGNOSIS — E669 Obesity, unspecified: Secondary | ICD-10-CM | POA: Diagnosis not present

## 2022-11-15 DIAGNOSIS — Z853 Personal history of malignant neoplasm of breast: Secondary | ICD-10-CM | POA: Diagnosis not present

## 2022-11-15 DIAGNOSIS — Z6832 Body mass index (BMI) 32.0-32.9, adult: Secondary | ICD-10-CM | POA: Diagnosis not present

## 2022-11-15 DIAGNOSIS — E1165 Type 2 diabetes mellitus with hyperglycemia: Secondary | ICD-10-CM | POA: Diagnosis not present

## 2022-11-22 DIAGNOSIS — Z6832 Body mass index (BMI) 32.0-32.9, adult: Secondary | ICD-10-CM | POA: Diagnosis not present

## 2022-11-22 DIAGNOSIS — M5417 Radiculopathy, lumbosacral region: Secondary | ICD-10-CM | POA: Diagnosis not present

## 2022-12-05 DIAGNOSIS — R200 Anesthesia of skin: Secondary | ICD-10-CM | POA: Diagnosis not present

## 2022-12-05 DIAGNOSIS — R202 Paresthesia of skin: Secondary | ICD-10-CM | POA: Diagnosis not present

## 2022-12-05 DIAGNOSIS — M48061 Spinal stenosis, lumbar region without neurogenic claudication: Secondary | ICD-10-CM | POA: Diagnosis not present

## 2022-12-05 DIAGNOSIS — M5417 Radiculopathy, lumbosacral region: Secondary | ICD-10-CM | POA: Diagnosis not present

## 2022-12-05 DIAGNOSIS — M4807 Spinal stenosis, lumbosacral region: Secondary | ICD-10-CM | POA: Diagnosis not present

## 2023-01-01 ENCOUNTER — Other Ambulatory Visit: Payer: Self-pay | Admitting: Internal Medicine

## 2023-01-01 DIAGNOSIS — Z1231 Encounter for screening mammogram for malignant neoplasm of breast: Secondary | ICD-10-CM

## 2023-01-24 DIAGNOSIS — M5417 Radiculopathy, lumbosacral region: Secondary | ICD-10-CM | POA: Diagnosis not present

## 2023-02-18 ENCOUNTER — Ambulatory Visit: Payer: Medicare HMO

## 2023-02-19 ENCOUNTER — Ambulatory Visit
Admission: RE | Admit: 2023-02-19 | Discharge: 2023-02-19 | Disposition: A | Discharge status: Home / Self Care | Payer: Medicare HMO | Source: Ambulatory Visit | Attending: Internal Medicine | Admitting: Internal Medicine

## 2023-02-19 ENCOUNTER — Other Ambulatory Visit: Payer: Self-pay | Admitting: Internal Medicine

## 2023-02-19 DIAGNOSIS — Z1231 Encounter for screening mammogram for malignant neoplasm of breast: Secondary | ICD-10-CM

## 2023-02-21 DIAGNOSIS — M5417 Radiculopathy, lumbosacral region: Secondary | ICD-10-CM | POA: Diagnosis not present

## 2023-02-21 DIAGNOSIS — M5136 Other intervertebral disc degeneration, lumbar region: Secondary | ICD-10-CM | POA: Diagnosis not present

## 2023-05-23 ENCOUNTER — Other Ambulatory Visit: Payer: Self-pay | Admitting: Internal Medicine

## 2023-05-23 DIAGNOSIS — E559 Vitamin D deficiency, unspecified: Secondary | ICD-10-CM | POA: Diagnosis not present

## 2023-05-23 DIAGNOSIS — I10 Essential (primary) hypertension: Secondary | ICD-10-CM | POA: Diagnosis not present

## 2023-05-23 DIAGNOSIS — I359 Nonrheumatic aortic valve disorder, unspecified: Secondary | ICD-10-CM | POA: Diagnosis not present

## 2023-05-23 DIAGNOSIS — F419 Anxiety disorder, unspecified: Secondary | ICD-10-CM | POA: Diagnosis not present

## 2023-05-23 DIAGNOSIS — M8588 Other specified disorders of bone density and structure, other site: Secondary | ICD-10-CM | POA: Diagnosis not present

## 2023-05-23 DIAGNOSIS — E1165 Type 2 diabetes mellitus with hyperglycemia: Secondary | ICD-10-CM | POA: Diagnosis not present

## 2023-05-23 DIAGNOSIS — R600 Localized edema: Secondary | ICD-10-CM | POA: Diagnosis not present

## 2023-05-23 DIAGNOSIS — E7800 Pure hypercholesterolemia, unspecified: Secondary | ICD-10-CM | POA: Diagnosis not present

## 2023-05-23 DIAGNOSIS — H6123 Impacted cerumen, bilateral: Secondary | ICD-10-CM | POA: Diagnosis not present

## 2023-05-23 DIAGNOSIS — M810 Age-related osteoporosis without current pathological fracture: Secondary | ICD-10-CM

## 2023-05-23 DIAGNOSIS — Z0000 Encounter for general adult medical examination without abnormal findings: Secondary | ICD-10-CM | POA: Diagnosis not present

## 2023-05-28 ENCOUNTER — Inpatient Hospital Stay
Admission: RE | Admit: 2023-05-28 | Discharge: 2023-05-28 | Disposition: A | Discharge status: Home / Self Care | Payer: Medicare HMO | Source: Ambulatory Visit | Attending: Internal Medicine

## 2023-05-28 DIAGNOSIS — E2839 Other primary ovarian failure: Secondary | ICD-10-CM | POA: Diagnosis not present

## 2023-05-28 DIAGNOSIS — N958 Other specified menopausal and perimenopausal disorders: Secondary | ICD-10-CM | POA: Diagnosis not present

## 2023-05-28 DIAGNOSIS — M8588 Other specified disorders of bone density and structure, other site: Secondary | ICD-10-CM | POA: Diagnosis not present

## 2023-05-28 DIAGNOSIS — M810 Age-related osteoporosis without current pathological fracture: Secondary | ICD-10-CM

## 2023-07-03 DIAGNOSIS — M5417 Radiculopathy, lumbosacral region: Secondary | ICD-10-CM | POA: Diagnosis not present

## 2023-08-10 ENCOUNTER — Encounter (HOSPITAL_COMMUNITY): Payer: Self-pay

## 2023-08-10 ENCOUNTER — Ambulatory Visit (HOSPITAL_COMMUNITY)
Admission: EM | Admit: 2023-08-10 | Discharge: 2023-08-10 | Disposition: A | Discharge status: Home / Self Care | Attending: Emergency Medicine | Admitting: Emergency Medicine

## 2023-08-10 DIAGNOSIS — J101 Influenza due to other identified influenza virus with other respiratory manifestations: Secondary | ICD-10-CM | POA: Diagnosis not present

## 2023-08-10 LAB — POC COVID19/FLU A&B COMBO
Covid Antigen, POC: NEGATIVE
Influenza A Antigen, POC: POSITIVE — AB
Influenza B Antigen, POC: NEGATIVE

## 2023-08-10 MED ORDER — OSELTAMIVIR PHOSPHATE 75 MG PO CAPS: 75.0000 mg | ORAL_CAPSULE | Freq: Two times a day (BID) | ORAL | 0 refills | Status: DC

## 2023-08-10 NOTE — ED Triage Notes (Signed)
 Patient here today with c/o headaches, fever, cough, body aches, and little runny nose since yesterday. Patient has been taking Tylenol with some relief. No known sick contacts.

## 2023-08-10 NOTE — Discharge Instructions (Signed)
 You have tested positive for flu A today. Start taking Tamiflu twice daily for 5 days. You can take Tylenol as needed for body aches and fever. I recommend Mucinex for cough and congestion. Stay hydrated and get plenty of rest. Return here if symptoms persist or worsen.

## 2023-08-10 NOTE — ED Provider Notes (Signed)
 [facility]    CSN: [id number] Arrival date & time: 08/10/23  1002      History   Chief Complaint Chief Complaint  Patient presents with   Fever    HPI Kimberly Arnold is a 73 y.o. female.   Patient presents with headache, fever, cough, body aches, and runny nose that began yesterday. Denies shortness of breath, chest pain, abdominal pain, nausea, vomiting, and diarrhea.   She was been taking Tylenol with some relief. Denies any known sick contacts.    Fever   Past Medical History:  Diagnosis Date   Cancer [facility])    lt breast-5 yrs ago (surgery only)-Dr. Murinson-released from care '01   Heart murmur    Hypertension    Osteopenia    Seasonal allergies    Spinal stenosis    Wears glasses     Patient Active Problem List   Diagnosis Date Noted   Age-related osteoporosis without current pathological fracture 01/30/2022   Allergic rhinitis 01/30/2022   Anxiety 01/30/2022   Aortic valve disorder 01/30/2022   Essential hypertension 01/30/2022   Personal history of malignant neoplasm of breast 01/30/2022   Spinal stenosis at L4-L5 level 01/30/2022   Vitamin D deficiency 01/30/2022    Past Surgical History:  Procedure Laterality Date   BREAST CAPSULECTOMY WITH IMPLANT EXCHANGE Left 11/09/2013   Procedure: REPLACEMENT OF LEFT BREAST IMPLANT CAPSULECTOMY RELEASE OF SCAR TISSUE ;  Surgeon: Louisa Second, MD;  Location: [facility];  Service: Plastics;  Laterality: Left;   BREAST RECONSTRUCTION  1989   left implant after mastectomy   COLONOSCOPY     COLONOSCOPY WITH PROPOFOL N/A 05/30/2015   Procedure: COLONOSCOPY WITH PROPOFOL;  Surgeon: Charolett Bumpers, MD;  Location: [facility];  Service: Endoscopy;  Laterality: N/A;   DILATION AND CURETTAGE OF UTERUS     MASTECTOMY Left    REDUCTION MAMMAPLASTY Right    SHOULDER ARTHROSCOPY W/ ROTATOR CUFF REPAIR  2008   left   SIMPLE MASTECTOMY  1989   left "modified radical masctectomy"-    TUBAL  LIGATION      OB History   No obstetric history on file.      Home Medications    Prior to Admission medications   Medication Sig Start Date End Date Taking? Authorizing Provider  oseltamivir (TAMIFLU) 75 MG capsule Take 1 capsule (75 mg total) by mouth every 12 (twelve) hours. 08/10/23  Yes Susann Givens, Merica Prell A, NP  amLODipine (NORVASC) 10 MG tablet Take 10 mg by mouth daily.    [provider]  aspirin EC 81 MG tablet Take 81 mg by mouth daily.    [provider]  cholecalciferol (VITAMIN D) 1000 UNITS tablet Take 1,000 Units by mouth daily.    [provider]  cloNIDine (CATAPRES) 0.1 MG tablet TAKE 1 TABLET TWICE DAILY (NEW DIRECTIONS)    [provider]  escitalopram (LEXAPRO) 5 MG tablet Take 5 mg by mouth daily. 08/06/21   [provider]  fluticasone (FLONASE) 50 MCG/ACT nasal spray Place 2 sprays into both nostrils daily as needed for allergies or rhinitis.     [provider]  ibuprofen (ADVIL) 200 MG tablet 1 tablet    [provider]  lidocaine (RE-LIEVED MAXIMUM STRENGTH) 4 % See admin instructions.    [provider]  loratadine (CLARITIN) 10 MG tablet Take 10 mg by mouth daily as needed for allergies.     [provider]  losartan (COZAAR) 100 MG tablet  1 tablet    [provider]  Multiple Vitamin (MULTIVITAMIN ADULT) TABS 1 tablet    [provider]  ondansetron (ZOFRAN) 4 MG tablet Take 1 tablet (4 mg total) by mouth every 6 (six) hours. 04/29/22   Gwyneth Sprout, MD    Family History Family History  Problem Relation Age of Onset   Breast cancer Neg Hx     Social History Social History   Tobacco Use   Smoking status: Never  Substance Use Topics   Alcohol use: Yes    Comment: occ. rare   Drug use: No     Allergies   Ace inhibitors, Angiotensin receptor blockers, Beta adrenergic blockers, Cephalexin, Hydrocodone, Hydrocodone-acetaminophen, Tetracyclines &  related, and Tramadol   Review of Systems Review of Systems  Constitutional:  Positive for fever.   Per HPI  Physical Exam Triage Vital Signs ED Triage Vitals  Encounter Vitals Group     BP 08/10/23 1012 (!) 157/94     Systolic BP Percentile --      Diastolic BP Percentile --      Pulse Rate 08/10/23 1012 88     Resp 08/10/23 1012 16     Temp 08/10/23 1012 100.2 F (37.9 C)     Temp Source 08/10/23 1012 Oral     SpO2 08/10/23 1012 94 %     Weight 08/10/23 1013 170 lb (77.1 kg)     Height 08/10/23 1013 5\' 1"  (1.549 m)     Head Circumference --      Peak Flow --      Pain Score 08/10/23 1013 8     Pain Loc --      Pain Education --      Exclude from Growth Chart --    No data found.  Updated Vital Signs BP (!) 157/94 (BP Location: Right Arm)   Pulse 88   Temp 100.2 F (37.9 C) (Oral)   Resp 16   Ht 5\' 1"  (1.549 m)   Wt 170 lb (77.1 kg)   SpO2 94%   BMI 32.12 kg/m   Visual Acuity Right Eye Distance:   Left Eye Distance:   Bilateral Distance:    Right Eye Near:   Left Eye Near:    Bilateral Near:     Physical Exam Vitals and nursing note reviewed.  Constitutional:      General: She is awake. She is not in acute distress.    Appearance: Normal appearance. She is well-developed and well-groomed. She is not ill-appearing.  HENT:     Right Ear: Tympanic membrane, ear canal and external ear normal.     Left Ear: Tympanic membrane, ear canal and external ear normal.     Nose: Congestion and rhinorrhea present.     Mouth/Throat:     Mouth: Mucous membranes are moist.     Pharynx: Posterior oropharyngeal erythema present. No oropharyngeal exudate.  Cardiovascular:     Rate and Rhythm: Normal rate and regular rhythm.  Pulmonary:     Effort: Pulmonary effort is normal.     Breath sounds: Normal breath sounds.  Musculoskeletal:        General: Normal range of motion.  Skin:    General: Skin is warm and dry.  Neurological:     Mental Status: She is alert.   Psychiatric:        Behavior: Behavior is cooperative.      UC Treatments / Results  Labs (all labs ordered are listed, but only  abnormal results are displayed) Labs Reviewed  POC COVID19/FLU A&B COMBO - Abnormal; Notable for the following components:      Result Value   Influenza A Antigen, POC Positive (*)    All other components within normal limits    EKG   Radiology No results found.  Procedures Procedures (including critical care time)  Medications Ordered in UC Medications - No data to display  Initial Impression / Assessment and Plan / UC Course  I have reviewed the triage vital signs and the nursing notes.  Pertinent labs & imaging results that were available during my care of the patient were reviewed by me and considered in my medical decision making (see chart for details).     Patient presented with headache, fever, cough, body aches, and runny nose that began yesterday.   Upon assessment congestion and rhinorrhea are present, mild erythema noted to pharynx. Lungs clear bilaterally on auscultation.  Tested positive for influenza A. Prescribed Tamiflu. Discussed OTC medication for symptoms. Discussed return precautions.  Final Clinical Impressions(s) / UC Diagnoses   Final diagnoses:  Influenza A     Discharge Instructions      You have tested positive for flu A today. Start taking Tamiflu twice daily for 5 days. You can take Tylenol as needed for body aches and fever. I recommend Mucinex for cough and congestion. Stay hydrated and get plenty of rest. Return here if symptoms persist or worsen.       ED Prescriptions     Medication Sig Dispense Auth. Provider   oseltamivir (TAMIFLU) 75 MG capsule Take 1 capsule (75 mg total) by mouth every 12 (twelve) hours. 10 capsule Wynonia Lawman A, NP      PDMP not reviewed this encounter.   Wynonia Lawman A, NP 08/10/23 1047

## 2023-09-10 DIAGNOSIS — H43393 Other vitreous opacities, bilateral: Secondary | ICD-10-CM | POA: Diagnosis not present

## 2023-09-10 DIAGNOSIS — H2513 Age-related nuclear cataract, bilateral: Secondary | ICD-10-CM | POA: Diagnosis not present

## 2023-09-10 DIAGNOSIS — H524 Presbyopia: Secondary | ICD-10-CM | POA: Diagnosis not present

## 2023-09-18 DIAGNOSIS — H524 Presbyopia: Secondary | ICD-10-CM | POA: Diagnosis not present

## 2023-10-03 DIAGNOSIS — M5417 Radiculopathy, lumbosacral region: Secondary | ICD-10-CM | POA: Diagnosis not present

## 2023-10-21 DIAGNOSIS — M5417 Radiculopathy, lumbosacral region: Secondary | ICD-10-CM | POA: Diagnosis not present

## 2023-12-12 DIAGNOSIS — E7800 Pure hypercholesterolemia, unspecified: Secondary | ICD-10-CM | POA: Diagnosis not present

## 2023-12-12 DIAGNOSIS — Z853 Personal history of malignant neoplasm of breast: Secondary | ICD-10-CM | POA: Diagnosis not present

## 2023-12-12 DIAGNOSIS — I10 Essential (primary) hypertension: Secondary | ICD-10-CM | POA: Diagnosis not present

## 2023-12-12 DIAGNOSIS — F419 Anxiety disorder, unspecified: Secondary | ICD-10-CM | POA: Diagnosis not present

## 2023-12-12 DIAGNOSIS — E1165 Type 2 diabetes mellitus with hyperglycemia: Secondary | ICD-10-CM | POA: Diagnosis not present

## 2023-12-12 DIAGNOSIS — M5136 Other intervertebral disc degeneration, lumbar region with discogenic back pain only: Secondary | ICD-10-CM | POA: Diagnosis not present

## 2023-12-12 DIAGNOSIS — J309 Allergic rhinitis, unspecified: Secondary | ICD-10-CM | POA: Diagnosis not present

## 2023-12-12 DIAGNOSIS — M62838 Other muscle spasm: Secondary | ICD-10-CM | POA: Diagnosis not present

## 2023-12-12 DIAGNOSIS — I359 Nonrheumatic aortic valve disorder, unspecified: Secondary | ICD-10-CM | POA: Diagnosis not present

## 2023-12-19 DIAGNOSIS — M1711 Unilateral primary osteoarthritis, right knee: Secondary | ICD-10-CM | POA: Diagnosis not present

## 2024-01-13 ENCOUNTER — Other Ambulatory Visit: Payer: Self-pay | Admitting: Internal Medicine

## 2024-01-13 DIAGNOSIS — Z1231 Encounter for screening mammogram for malignant neoplasm of breast: Secondary | ICD-10-CM

## 2024-01-23 DIAGNOSIS — M5417 Radiculopathy, lumbosacral region: Secondary | ICD-10-CM | POA: Diagnosis not present

## 2024-02-20 ENCOUNTER — Ambulatory Visit
Admission: RE | Admit: 2024-02-20 | Discharge: 2024-02-20 | Disposition: A | Discharge status: Home / Self Care | Source: Ambulatory Visit | Attending: Internal Medicine

## 2024-02-20 DIAGNOSIS — Z1231 Encounter for screening mammogram for malignant neoplasm of breast: Secondary | ICD-10-CM | POA: Diagnosis not present

## 2024-03-19 IMAGING — DX DG FOOT COMPLETE 3+V*L*
3 series · 3 of 3 positions shown · non-contrast
Comparison: None Available.

CLINICAL DATA: Trauma, fall, pain

EXAM:
LEFT FOOT - COMPLETE 3+ VIEW

[foot ap]
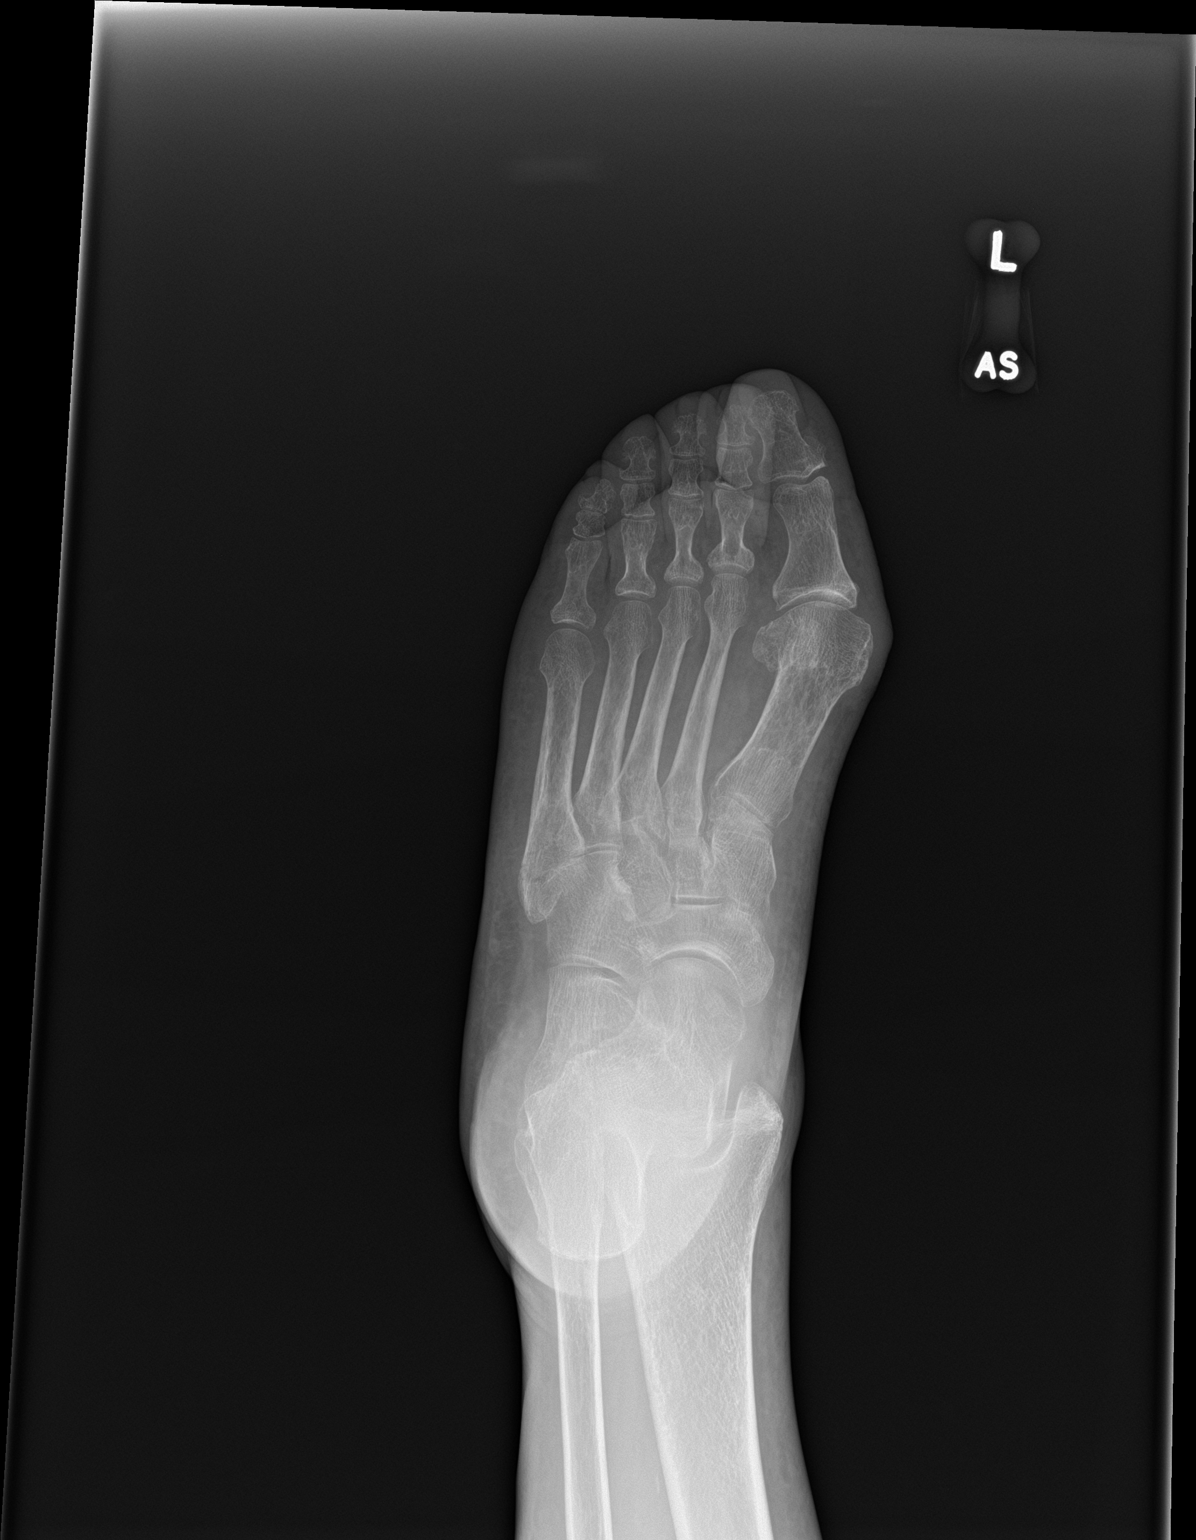

[foot obl]
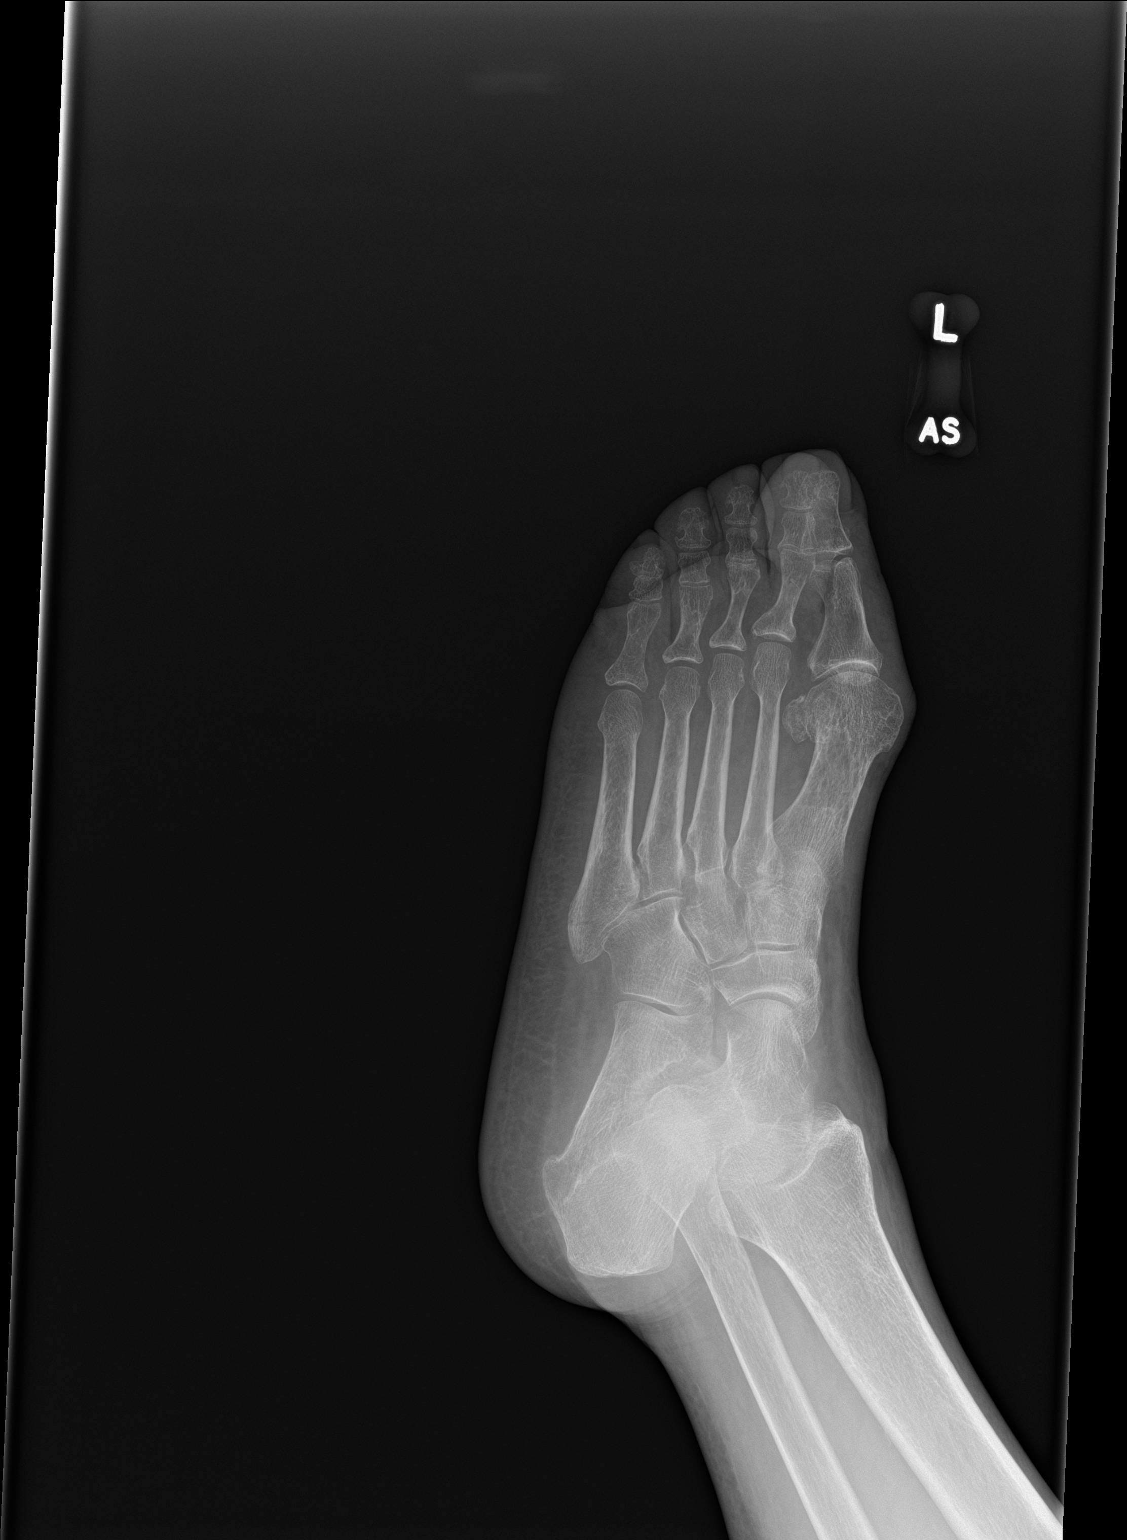

[foot lat]
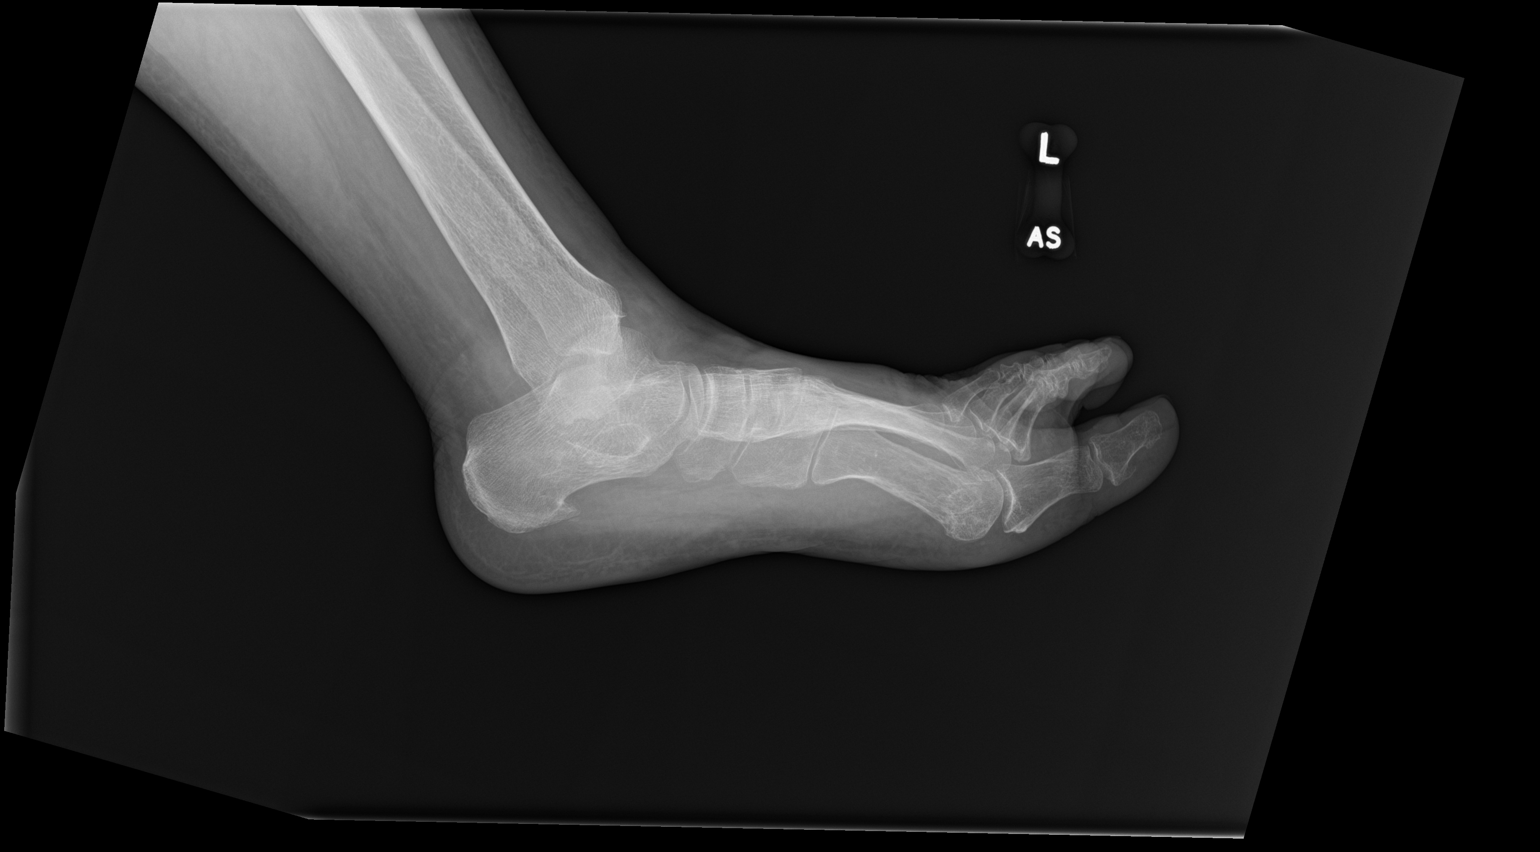

[3 of 3 positions shown; findings below may reference images not displayed]

FINDINGS: There is faint radiolucent line in the base of left fifth
metatarsal. Rest of the bony structures appear intact. Hallux valgus
deformity is noted. Degenerative changes are noted in first
metatarsophalangeal joint. Plantar spur is seen in the calcaneus.
IMPRESSION: Recent undisplaced fracture is seen in the base of left fifth
metatarsal.

Other findings as described in the body of the report.

## 2024-04-20 DIAGNOSIS — Z7982 Long term (current) use of aspirin: Secondary | ICD-10-CM | POA: Diagnosis not present

## 2024-04-20 DIAGNOSIS — R011 Cardiac murmur, unspecified: Secondary | ICD-10-CM | POA: Diagnosis not present

## 2024-04-20 DIAGNOSIS — M519 Unspecified thoracic, thoracolumbar and lumbosacral intervertebral disc disorder: Secondary | ICD-10-CM | POA: Diagnosis not present

## 2024-04-20 DIAGNOSIS — F411 Generalized anxiety disorder: Secondary | ICD-10-CM | POA: Diagnosis not present

## 2024-04-20 DIAGNOSIS — E1136 Type 2 diabetes mellitus with diabetic cataract: Secondary | ICD-10-CM | POA: Diagnosis not present

## 2024-04-20 DIAGNOSIS — N959 Unspecified menopausal and perimenopausal disorder: Secondary | ICD-10-CM | POA: Diagnosis not present

## 2024-04-20 DIAGNOSIS — E669 Obesity, unspecified: Secondary | ICD-10-CM | POA: Diagnosis not present

## 2024-04-20 DIAGNOSIS — Z9989 Dependence on other enabling machines and devices: Secondary | ICD-10-CM | POA: Diagnosis not present

## 2024-04-20 DIAGNOSIS — Z853 Personal history of malignant neoplasm of breast: Secondary | ICD-10-CM | POA: Diagnosis not present

## 2024-04-20 DIAGNOSIS — M62838 Other muscle spasm: Secondary | ICD-10-CM | POA: Diagnosis not present

## 2024-04-20 DIAGNOSIS — F324 Major depressive disorder, single episode, in partial remission: Secondary | ICD-10-CM | POA: Diagnosis not present

## 2024-04-20 DIAGNOSIS — I10 Essential (primary) hypertension: Secondary | ICD-10-CM | POA: Diagnosis not present

## 2024-04-20 DIAGNOSIS — E1142 Type 2 diabetes mellitus with diabetic polyneuropathy: Secondary | ICD-10-CM | POA: Diagnosis not present

## 2024-04-20 DIAGNOSIS — M8580 Other specified disorders of bone density and structure, unspecified site: Secondary | ICD-10-CM | POA: Diagnosis not present

## 2024-04-20 DIAGNOSIS — I358 Other nonrheumatic aortic valve disorders: Secondary | ICD-10-CM | POA: Diagnosis not present

## 2024-04-26 ENCOUNTER — Ambulatory Visit (HOSPITAL_COMMUNITY): Payer: Self-pay | Admitting: Physician Assistant

## 2024-04-26 ENCOUNTER — Ambulatory Visit (HOSPITAL_COMMUNITY)
Admission: EM | Admit: 2024-04-26 | Discharge: 2024-04-26 | Disposition: A | Discharge status: Home / Self Care | Attending: Physician Assistant | Admitting: Physician Assistant

## 2024-04-26 ENCOUNTER — Encounter (HOSPITAL_COMMUNITY): Payer: Self-pay

## 2024-04-26 DIAGNOSIS — M7989 Other specified soft tissue disorders: Secondary | ICD-10-CM | POA: Diagnosis not present

## 2024-04-26 DIAGNOSIS — M79604 Pain in right leg: Secondary | ICD-10-CM | POA: Diagnosis not present

## 2024-04-26 DIAGNOSIS — R233 Spontaneous ecchymoses: Secondary | ICD-10-CM | POA: Insufficient documentation

## 2024-04-26 LAB — CBC WITH DIFFERENTIAL/PLATELET
Abs Immature Granulocytes: 0.02 K/uL (ref 0.00–0.07)
Basophils Absolute: 0.1 K/uL (ref 0.0–0.1)
Basophils Relative: 1 %
Eosinophils Absolute: 0.2 K/uL (ref 0.0–0.5)
Eosinophils Relative: 3 %
HCT: 44.4 % (ref 36.0–46.0)
Hemoglobin: 14.2 g/dL (ref 12.0–15.0)
Immature Granulocytes: 0 %
Lymphocytes Relative: 31 %
Lymphs Abs: 2.5 K/uL (ref 0.7–4.0)
MCH: 26.3 pg (ref 26.0–34.0)
MCHC: 32 g/dL (ref 30.0–36.0)
MCV: 82.2 fL (ref 80.0–100.0)
Monocytes Absolute: 0.7 K/uL (ref 0.1–1.0)
Monocytes Relative: 9 %
Neutro Abs: 4.5 K/uL (ref 1.7–7.7)
Neutrophils Relative %: 56 %
Platelets: 238 K/uL (ref 150–400)
RBC: 5.4 MIL/uL — ABNORMAL HIGH (ref 3.87–5.11)
RDW: 15.8 % — ABNORMAL HIGH (ref 11.5–15.5)
WBC: 8.1 K/uL (ref 4.0–10.5)
nRBC: 0 % (ref 0.0–0.2)

## 2024-04-26 LAB — COMPREHENSIVE METABOLIC PANEL WITH GFR
ALT: 29 U/L (ref 0–44)
AST: 30 U/L (ref 15–41)
Albumin: 4 g/dL (ref 3.5–5.0)
Alkaline Phosphatase: 78 U/L (ref 38–126)
Anion gap: 12 (ref 5–15)
BUN: 12 mg/dL (ref 8–23)
CO2: 22 mmol/L (ref 22–32)
Calcium: 10.2 mg/dL (ref 8.9–10.3)
Chloride: 107 mmol/L (ref 98–111)
Creatinine, Ser: 0.79 mg/dL (ref 0.44–1.00)
GFR, Estimated: >60 mL/min (ref >60)
Glucose, Bld: 90 mg/dL (ref 70–99)
Potassium: 3.7 mmol/L (ref 3.5–5.1)
Sodium: 141 mmol/L (ref 135–145)
Total Bilirubin: 0.8 mg/dL (ref 0.0–1.2)
Total Protein: 7.9 g/dL (ref 6.5–8.1)

## 2024-04-26 LAB — POCT URINE DIPSTICK
Bilirubin, UA: NEGATIVE
Glucose, UA: NEGATIVE mg/dL
Ketones, POC UA: NEGATIVE mg/dL
Leukocytes, UA: NEGATIVE
Nitrite, UA: NEGATIVE
Spec Grav, UA: 1.015 (ref 1.010–1.025)
Urobilinogen, UA: 0.2 U/dL
pH, UA: 6 (ref 5.0–8.0)

## 2024-04-26 LAB — PROTIME-INR
INR: 1 (ref 0.8–1.2)
Prothrombin Time: 13.6 s (ref 11.4–15.2)

## 2024-04-26 LAB — SEDIMENTATION RATE: Sed Rate: 9 mm/h (ref 0–22)

## 2024-04-26 LAB — APTT: aPTT: 37 s — ABNORMAL HIGH (ref 24–36)

## 2024-04-26 LAB — C-REACTIVE PROTEIN: CRP: 0.5 mg/dL (ref <1.0)

## 2024-04-26 MED ORDER — PREDNISONE 10 MG (21) PO TBPK: ORAL_TABLET | ORAL | 0 refills | Status: AC

## 2024-04-26 NOTE — Discharge Instructions (Signed)
 I am concerned that this rash is related to inflammation in your blood vessels.  Start prednisone as prescribed.  Do not take NSAIDs with this medication including aspirin, ibuprofen/Advil, naproxen/Aleve.  I will contact you if any of the blood work is abnormal.  Given the swelling and pain in your leg I do recommend you go get the ultrasound tomorrow to make sure that there is not a blood clot.  If anything worsens overnight and you have chest pain, shortness of breath, heart racing, increasing pain you need to go to the ER.  Follow-up with your primary care first thing next week.  If anything changes to be seen immediately.

## 2024-04-26 NOTE — ED Triage Notes (Signed)
 Right leg swelling and rash with pain onset yesterday. States the swelling has been up and down, the rash is painful along the internal shin.   Denies any history of rash like this.

## 2024-04-26 NOTE — ED Provider Notes (Signed)
 [facility]    CSN: [id number] Arrival date & time: 04/26/24  1130      History   Chief Complaint Chief Complaint  Patient presents with   Leg Pain   Rash    HPI Kimberly Arnold is a 73 y.o. female.   Patient presents today with a painful rash on her right lower leg that began yesterday.  She reports that pain is rated 10 on a 0-10 pain scale, described as sharp, worse with palpation or movement, no alleviating factors identified.  She reports associated leg swelling.  She does have a history of dependent edema but states current symptoms are not similar to episodes this condition as it did not resolve with elevation and appears to be more in the right.  She denies history of rheumatological condition.  She denies any fever, nausea, vomiting, chest pain, shortness of breath.  She denies any history of VTE event; has a history of remote malignancy but is not currently receiving treatment.  Denies any recent immobilization or hospitalization.  She denies any recent medication changes.    Past Medical History:  Diagnosis Date   Cancer [facility])    lt breast-5 yrs ago (surgery only)-Dr. Murinson-released from care '01   Heart murmur    Hypertension    Osteopenia    Seasonal allergies    Spinal stenosis    Wears glasses     Patient Active Problem List   Diagnosis Date Noted   Age-related osteoporosis without current pathological fracture 01/30/2022   Allergic rhinitis 01/30/2022   Anxiety 01/30/2022   Aortic valve disorder 01/30/2022   Essential hypertension 01/30/2022   Personal history of malignant neoplasm of breast 01/30/2022   Spinal stenosis at L4-L5 level 01/30/2022   Vitamin D deficiency 01/30/2022    Past Surgical History:  Procedure Laterality Date   BREAST CAPSULECTOMY WITH IMPLANT EXCHANGE Left 11/09/2013   Procedure: REPLACEMENT OF LEFT BREAST IMPLANT CAPSULECTOMY RELEASE OF SCAR TISSUE ;  Surgeon: Elna Pick, MD;  Location: [facility];  Service: Plastics;  Laterality: Left;   BREAST RECONSTRUCTION  1989   left implant after mastectomy   COLONOSCOPY     COLONOSCOPY WITH PROPOFOL  N/A 05/30/2015   Procedure: COLONOSCOPY WITH PROPOFOL ;  Surgeon: Gladis MARLA Louder, MD;  Location: [facility];  Service: Endoscopy;  Laterality: N/A;   DILATION AND CURETTAGE OF UTERUS     MASTECTOMY Left    REDUCTION MAMMAPLASTY Right    SHOULDER ARTHROSCOPY W/ ROTATOR CUFF REPAIR  2008   left   SIMPLE MASTECTOMY  1989   left modified radical masctectomy-    TUBAL LIGATION      OB History   No obstetric history on file.      Home Medications    Prior to Admission medications   Medication Sig Start Date End Date Taking? Authorizing Provider  amLODipine (NORVASC) 10 MG tablet Take 10 mg by mouth daily.   Yes [provider]  aspirin EC 81 MG tablet Take 81 mg by mouth daily.   Yes [provider]  cholecalciferol (VITAMIN D) 1000 UNITS tablet Take 1,000 Units by mouth daily.   Yes [provider]  cloNIDine (CATAPRES) 0.1 MG tablet TAKE 1 TABLET TWICE DAILY (NEW DIRECTIONS)   Yes [provider]  fluticasone (FLONASE) 50 MCG/ACT nasal spray Place 2 sprays into both nostrils daily as needed for allergies or rhinitis.    Yes [provider]  lidocaine  (RE-LIEVED MAXIMUM STRENGTH) 4 % See  admin instructions.   Yes [provider]  loratadine (CLARITIN) 10 MG tablet Take 10 mg by mouth daily as needed for allergies.    Yes [provider]  losartan (COZAAR) 100 MG tablet 1 tablet   Yes [provider]  Multiple Vitamin (MULTIVITAMIN ADULT) TABS 1 tablet   Yes [provider]  ondansetron  (ZOFRAN ) 4 MG tablet Take 1 tablet (4 mg total) by mouth every 6 (six) hours. 04/29/22  Yes Plunkett, Benton, MD  predniSONE (STERAPRED UNI-PAK 21 TAB) 10 MG (21) TBPK tablet Take 40 mg (4 tablets) days 1 and 2, take 30 mg (3 tablets) days 3 and 4, take 20 mg (2  tablets) days 5 and 6, take 10 mg (1 tablet) days 7 and 8, take 5 mg (0.5 tablet) days 9 and 10, then stop. 04/26/24  Yes Shelsea Hangartner K, PA-C  tiZANidine (ZANAFLEX) 4 MG tablet Take 4 mg by mouth 2 (two) times daily as needed. 02/01/24  Yes [provider]    Family History Family History  Problem Relation Age of Onset   Breast cancer Neg Hx     Social History Social History   Tobacco Use   Smoking status: Never  Substance Use Topics   Alcohol use: Yes    Comment: occ. rare   Drug use: No     Allergies   Ace inhibitors, Angiotensin receptor blockers, Beta adrenergic blockers, Cephalexin, Hydrocodone , Hydrocodone -acetaminophen , Tetracyclines & related, and Tramadol   Review of Systems Review of Systems  Constitutional:  Positive for activity change. Negative for appetite change, fatigue and fever.  Respiratory:  Negative for cough and shortness of breath.   Cardiovascular:  Positive for leg swelling. Negative for chest pain and palpitations.  Gastrointestinal:  Negative for vomiting.  Musculoskeletal:  Positive for myalgias. Negative for arthralgias.  Skin:  Positive for rash. Negative for color change and wound.     Physical Exam Triage Vital Signs ED Triage Vitals  Encounter Vitals Group     BP 04/26/24 1328 (!) 146/89     Girls Systolic BP Percentile --      Girls Diastolic BP Percentile --      Boys Systolic BP Percentile --      Boys Diastolic BP Percentile --      Pulse Rate 04/26/24 1328 67     Resp 04/26/24 1328 18     Temp 04/26/24 1328 98 F (36.7 C)     Temp Source 04/26/24 1328 Oral     SpO2 04/26/24 1328 93 %     Weight --      Height --      Head Circumference --      Peak Flow --      Pain Score 04/26/24 1326 10     Pain Loc --      Pain Education --      Exclude from Growth Chart --    No data found.  Updated Vital Signs BP (!) 146/89 (BP Location: Left Arm)   Pulse 67   Temp 98 F (36.7 C) (Oral)   Resp 18   SpO2 93%    Visual Acuity Right Eye Distance:   Left Eye Distance:   Bilateral Distance:    Right Eye Near:   Left Eye Near:    Bilateral Near:     Physical Exam Vitals reviewed.  Constitutional:      General: She is awake. She is not in acute distress.    Appearance: Normal appearance.  She is well-developed. She is not ill-appearing.     Comments: Very pleasant female appears stated age in no acute distress sitting comfortably in a wheelchair  HENT:     Head: Normocephalic and atraumatic.  Cardiovascular:     Rate and Rhythm: Normal rate and regular rhythm.     Heart sounds: S1 normal and S2 normal. Murmur heard.     Comments: 2+ pitting edema to knee on right.  Negative Homans' sign. Pulmonary:     Effort: Pulmonary effort is normal.     Breath sounds: Normal breath sounds. No wheezing, rhonchi or rales.     Comments: Clear to auscultation bilaterally Abdominal:     Palpations: Abdomen is soft.     Tenderness: There is no abdominal tenderness.  Musculoskeletal:     Right lower leg: Tenderness present. No deformity or bony tenderness. 2+ Edema present.     Left lower leg: No edema.     Comments: Tender to palpation of right medial lower leg.  Skin:    Findings: Petechiae and rash present.     Comments: Nonblanching petechial rash noted medial upper right lower leg.  No vesicles noted.  Area is tender to palpation.  Psychiatric:        Behavior: Behavior is cooperative.      UC Treatments / Results  Labs (all labs ordered are listed, but only abnormal results are displayed) Labs Reviewed  POCT URINE DIPSTICK - Abnormal; Notable for the following components:      Result Value   Blood, UA small (*)    All other components within normal limits  CBC WITH DIFFERENTIAL/PLATELET  COMPREHENSIVE METABOLIC PANEL WITH GFR  SEDIMENTATION RATE  C-REACTIVE PROTEIN  PROTIME-INR  APTT    EKG   Radiology No results found.  Procedures Procedures (including critical care  time)  Medications Ordered in UC Medications - No data to display  Initial Impression / Assessment and Plan / UC Course  I have reviewed the triage vital signs and the nursing notes.  Pertinent labs & imaging results that were available during my care of the patient were reviewed by me and considered in my medical decision making (see chart for details).     Patient is well-appearing, afebrile, nontoxic, nontachycardic.  Unclear etiology of symptoms but I am concerned for vasculitis given her clinical presentation.  Blood work to investigate this including PT/INR, PTT, CBC, CMP, inflammatory markers were obtained and are pending at the time of discharge.  UA had small blood but no evidence of proteinuria.  Recommended this be repeated with her primary care.  We will treat for vasculitis with prednisone taper and we discussed that she should not take NSAIDs with this medication due to risk of GI bleeding.  Given unilateral leg pain and swelling I am also going to obtain a ultrasound to rule out a blood clot.  She is not experiencing any alarm symptoms but we discussed that if she develops any chest pain, palpitations, shortness of breath she should go to the ER.  Recommend close follow-up with her primary care first thing next week.  Strict return precautions given.  All questions answered to patient satisfaction.  She expressed understanding and agreement treatment plan.  Final Clinical Impressions(s) / UC Diagnoses   Final diagnoses:  Petechial rash  Right leg swelling  Right leg pain     Discharge Instructions      I am concerned that this rash is related to inflammation in your blood vessels.  Start prednisone as prescribed.  Do not take NSAIDs with this medication including aspirin, ibuprofen/Advil, naproxen/Aleve.  I will contact you if any of the blood work is abnormal.  Given the swelling and pain in your leg I do recommend you go get the ultrasound tomorrow to make sure that there  is not a blood clot.  If anything worsens overnight and you have chest pain, shortness of breath, heart racing, increasing pain you need to go to the ER.  Follow-up with your primary care first thing next week.  If anything changes to be seen immediately.     ED Prescriptions     Medication Sig Dispense Auth. Provider   predniSONE (STERAPRED UNI-PAK 21 TAB) 10 MG (21) TBPK tablet Take 40 mg (4 tablets) days 1 and 2, take 30 mg (3 tablets) days 3 and 4, take 20 mg (2 tablets) days 5 and 6, take 10 mg (1 tablet) days 7 and 8, take 5 mg (0.5 tablet) days 9 and 10, then stop. 21 tablet Oscar Forman K, PA-C      PDMP not reviewed this encounter.   Sherrell Rocky POUR, PA-C 04/26/24 1445

## 2024-04-27 ENCOUNTER — Other Ambulatory Visit (HOSPITAL_COMMUNITY): Payer: Self-pay | Admitting: Physician Assistant

## 2024-04-27 ENCOUNTER — Ambulatory Visit (HOSPITAL_COMMUNITY)
Admission: RE | Admit: 2024-04-27 | Discharge: 2024-04-27 | Disposition: A | Discharge status: Home / Self Care | Source: Ambulatory Visit | Attending: Physician Assistant | Admitting: Physician Assistant

## 2024-04-27 DIAGNOSIS — M7989 Other specified soft tissue disorders: Secondary | ICD-10-CM | POA: Diagnosis not present

## 2024-04-27 DIAGNOSIS — M79661 Pain in right lower leg: Secondary | ICD-10-CM | POA: Diagnosis not present

## 2024-04-27 DIAGNOSIS — R600 Localized edema: Secondary | ICD-10-CM | POA: Diagnosis not present

## 2024-04-27 DIAGNOSIS — R233 Spontaneous ecchymoses: Secondary | ICD-10-CM

## 2024-04-27 DIAGNOSIS — M79604 Pain in right leg: Secondary | ICD-10-CM
